# Patient Record
Sex: Male | Born: 1970 | Race: White | Hispanic: No | Marital: Married | Smoking: Former smoker
Health system: Southern US, Community
[De-identification: ages and names within clinical notes are randomized; demographics above are authoritative.]

## PROBLEM LIST (undated history)

## (undated) DIAGNOSIS — E785 Hyperlipidemia, unspecified: Secondary | ICD-10-CM

## (undated) DIAGNOSIS — Z8674 Personal history of sudden cardiac arrest: Secondary | ICD-10-CM

## (undated) DIAGNOSIS — I5020 Unspecified systolic (congestive) heart failure: Secondary | ICD-10-CM

## (undated) DIAGNOSIS — I2510 Atherosclerotic heart disease of native coronary artery without angina pectoris: Secondary | ICD-10-CM

## (undated) DIAGNOSIS — I255 Ischemic cardiomyopathy: Secondary | ICD-10-CM

## (undated) HISTORY — DX: Atherosclerotic heart disease of native coronary artery without angina pectoris: I25.10

## (undated) HISTORY — PX: CARDIAC CATHETERIZATION: SHX172

## (undated) HISTORY — DX: Unspecified systolic (congestive) heart failure: I50.20

## (undated) HISTORY — DX: Ischemic cardiomyopathy: I25.5

## (undated) HISTORY — DX: Personal history of sudden cardiac arrest: Z86.74

## (undated) HISTORY — DX: Hyperlipidemia, unspecified: E78.5

---

## 2010-12-01 DIAGNOSIS — I2510 Atherosclerotic heart disease of native coronary artery without angina pectoris: Secondary | ICD-10-CM

## 2010-12-01 HISTORY — DX: Atherosclerotic heart disease of native coronary artery without angina pectoris: I25.10

## 2011-07-28 HISTORY — PX: CORONARY ARTERY BYPASS GRAFT: SHX141

## 2011-08-08 HISTORY — PX: CARDIAC DEFIBRILLATOR PLACEMENT: SHX171

## 2014-07-26 ENCOUNTER — Encounter: Payer: Self-pay | Admitting: Internal Medicine

## 2014-07-26 ENCOUNTER — Ambulatory Visit (INDEPENDENT_AMBULATORY_CARE_PROVIDER_SITE_OTHER): Payer: BC Managed Care – HMO | Admitting: Internal Medicine

## 2014-07-26 VITALS — BP 98/72 | HR 64 | Ht 71.0 in | Wt 222.0 lb

## 2014-07-26 DIAGNOSIS — I209 Angina pectoris, unspecified: Secondary | ICD-10-CM

## 2014-07-26 DIAGNOSIS — I25810 Atherosclerosis of coronary artery bypass graft(s) without angina pectoris: Secondary | ICD-10-CM | POA: Insufficient documentation

## 2014-07-26 DIAGNOSIS — I25709 Atherosclerosis of coronary artery bypass graft(s), unspecified, with unspecified angina pectoris: Secondary | ICD-10-CM

## 2014-07-26 DIAGNOSIS — I4901 Ventricular fibrillation: Secondary | ICD-10-CM

## 2014-07-26 DIAGNOSIS — I469 Cardiac arrest, cause unspecified: Secondary | ICD-10-CM

## 2014-07-26 DIAGNOSIS — I2589 Other forms of chronic ischemic heart disease: Secondary | ICD-10-CM

## 2014-07-26 DIAGNOSIS — Z95810 Presence of automatic (implantable) cardiac defibrillator: Secondary | ICD-10-CM

## 2014-07-26 DIAGNOSIS — I255 Ischemic cardiomyopathy: Secondary | ICD-10-CM

## 2014-07-26 LAB — MDC_IDC_ENUM_SESS_TYPE_INCLINIC
Date Time Interrogation Session: 20150826145204
HIGH POWER IMPEDANCE MEASURED VALUE: 43.6103
Lead Channel Impedance Value: 412.5 Ohm
Lead Channel Pacing Threshold Amplitude: 1 V
Lead Channel Sensing Intrinsic Amplitude: 12 mV
Lead Channel Setting Pacing Pulse Width: 0.5 ms
MDC IDC MSMT BATTERY REMAINING LONGEVITY: 85.2 mo
MDC IDC MSMT LEADCHNL RV PACING THRESHOLD PULSEWIDTH: 0.5 ms
MDC IDC PG SERIAL: 1027081
MDC IDC SET LEADCHNL RV PACING AMPLITUDE: 2.5 V
MDC IDC SET LEADCHNL RV SENSING SENSITIVITY: 0.5 mV
MDC IDC STAT BRADY RV PERCENT PACED: 0 %
Zone Setting Detection Interval: 320 ms

## 2014-07-26 MED ORDER — NITROGLYCERIN 0.4 MG SL SUBL: 0.4000 mg | SUBLINGUAL_TABLET | SUBLINGUAL | Status: DC | PRN

## 2014-07-26 NOTE — Progress Notes (Signed)
Primary Cardiologist:  To establish with Dr Kirke Corin in [facility] Hobby is a 43 y.o. male with a h/o CAD s/p STEMI with cardiogenic shock 2012 who presents to establish care in the EP clinic.  He is s/p ICD implant at [facility] and now presents to establish in our clinic after recently moving to [facility] for his wife's job.  He reports being in good health until 07/08/2011 when he had acute STEMI.  He presented and underwent cath which showed 100% LAD occlusion, 100% RCA occlusion, and 80% LCx stenosis.  Intervention was performed but per report, this was aborted due to dissection.  He underwent CAG 07/28/11.  2 days later, he had a VF arrest.  Emergent cath revealed patent CABG grafts but distal CAD distal to graft touchdown.  He underwent implantation of a SJM Fortify ST VR ICD 08/08/11.  He has done reasonably well since that time.  He reports NYHA Class III CHF symptoms.  Medical therapy has been limited by hypotension and postural dizziness.  He has stable angina.  He reports SOB with moderate activity.  He has stable BLE edema.  He says that he had a myoview 4/15 which revealed EF 29%.    Today, he denies symptoms of palpitations, orthopnea, PND,  presyncope, syncope, or neurologic sequela. The patient is tolerating medications without difficulties and is otherwise without complaint today.   Past Medical History  Diagnosis Date  . CAD (coronary artery disease) 2012    STEMI 07/08/11  . Ischemic cardiomyopathy   . Chronic systolic dysfunction of left ventricle   . H/O cardiac arrest     VF arrest 2 days following CABG   Past Surgical History  Procedure Laterality Date  . Coronary artery bypass graft  07/28/11    LIMA to OM, SVG to LAD, SVGF to PDA  . Cardiac defibrillator placement  08/08/11    SJM Fortify ST VR implanted by Dr [facility]    Current Outpatient Prescriptions  Medication Sig Dispense Refill  . allopurinol (ZYLOPRIM) 100 MG tablet Take 100  mg by mouth daily.      Marland Kitchen aspirin 81 MG tablet Take 81 mg by mouth daily.      Marland Kitchen atorvastatin (LIPITOR) 40 MG tablet Take 40 mg by mouth daily.      Marland Kitchen eplerenone (INSPRA) 50 MG tablet Take 50 mg by mouth daily.      Marland Kitchen lisinopril (PRINIVIL,ZESTRIL) 5 MG tablet Take 5 mg by mouth daily.      . magnesium oxide (MAG-OX) 400 MG tablet Take 400 mg by mouth 2 (two) times daily.      . metoprolol succinate (TOPROL-XL) 25 MG 24 hr tablet Take 25 mg by mouth daily.      . Multiple Vitamin (MULTIVITAMIN) capsule Take 1 capsule by mouth daily.      Marland Kitchen omeprazole (PRILOSEC) 20 MG capsule Take 20 mg by mouth daily.      Marland Kitchen torsemide (DEMADEX) 10 MG tablet Take 10 mg by mouth every morning.      . nitroGLYCERIN (NITROSTAT) 0.4 MG SL tablet Place 1 tablet (0.4 mg total) under the tongue every 5 (five) minutes as needed for chest pain.  90 tablet  3   No current facility-administered medications for this visit.    Allergies  Allergen Reactions  . Penicillins Hives    As a child    History   Social History  . Marital Status: Married  Spouse Name: N/A    Number of Children: N/A  . Years of Education: N/A   Occupational History  . Not on file.   Social History Main Topics  . Smoking status: Former Smoker -- 1.00 packs/day for 15 years    Types: Cigarettes    Quit date: 07/02/2011  . Smokeless tobacco: Not on file  . Alcohol Use: No  . Drug Use: No  . Sexual Activity: Not on file   Other Topics Concern  . Not on file   Social History Narrative   Recently moved to [facility] for his wife's job.  He is currently unemployed.    Family History  Problem Relation Age of Onset  . Cancer Father     ROS- All systems are reviewed and negative except as per the HPI above  Physical Exam: Filed Vitals:   07/26/14 1355  BP: 98/72  Pulse: 64  Height:  (1.803 m)  Weight: 222 lb (100.699 kg)    GEN- The patient is well appearing, alert and oriented x 3 today.   Head- normocephalic,  atraumatic Eyes-  Sclera clear, conjunctiva pink Ears- hearing intact Oropharynx- clear Neck- supple, no JVP Lymph- no cervical lymphadenopathy Lungs- Clear to ausculation bilaterally, normal work of breathing Heart- Regular rate and rhythm, no murmurs, rubs or gallops, PMI not laterally displaced GI- soft, NT, ND, + BS Extremities- no clubbing, cyanosis, +1 edema MS- no significant deformity or atrophy Skin- no rash or lesion, ICD pocket is well healed Psych- euthymic mood, full affect Neuro- strength and sensation are intact  EKG today reveals sinus rhythm 64 bpm, PR 190, QRS 104, Qtc 420, anterior MI Over 50 pages of records from [facility] are reviewed ICD interrogation today is reviewed (see paceart)  Assessment and Plan:   1. Ischemic CM/ chronic systolic dysfunction/CAD The patient has stable CAD with chronic NYHA Class III CHF.  He appears to be on a good medical regimen Normal ICD function See Arita Miss Art report No changes today I will refer to Dr Kirke Corin for general cardiology management.   He is in the Analyze ST clinical trial .  We will follow his ICD twice per year according to Analyze ST protocol in our office.

## 2014-07-26 NOTE — Patient Instructions (Addendum)
Your physician wants you to follow-up in: 6 months with device clinic/research and 12 months with Dr Jacquiline Doe will receive a reminder letter in the mail two months in advance. If you don't receive a letter, please call our office to schedule the follow-up appointment.  Remote monitoring is used to monitor your Pacemaker or ICD from home. This monitoring reduces the number of office visits required to check your device to one time per year. It allows [place] to keep an eye on the functioning of your device to ensure it is working properly. You are scheduled for a device check from home on 10/30/14. You may send your transmission at any time that day. If you have a wireless device, the transmission will be sent automatically. After your physician reviews your transmission, you will receive a postcard with your next transmission date.   You have been referred to Dr. Kirke Corin in [place] office for general cardiology care

## 2014-07-28 ENCOUNTER — Encounter: Payer: Self-pay | Admitting: Internal Medicine

## 2014-08-16 ENCOUNTER — Encounter: Payer: Self-pay | Admitting: Cardiovascular Disease

## 2014-08-16 ENCOUNTER — Ambulatory Visit (INDEPENDENT_AMBULATORY_CARE_PROVIDER_SITE_OTHER): Payer: BC Managed Care – HMO | Admitting: Cardiovascular Disease

## 2014-08-16 VITALS — BP 119/79 | HR 69 | Ht 71.0 in | Wt 221.5 lb

## 2014-08-16 DIAGNOSIS — I209 Angina pectoris, unspecified: Secondary | ICD-10-CM

## 2014-08-16 DIAGNOSIS — I25709 Atherosclerosis of coronary artery bypass graft(s), unspecified, with unspecified angina pectoris: Secondary | ICD-10-CM

## 2014-08-16 DIAGNOSIS — I2589 Other forms of chronic ischemic heart disease: Secondary | ICD-10-CM

## 2014-08-16 DIAGNOSIS — E785 Hyperlipidemia, unspecified: Secondary | ICD-10-CM

## 2014-08-16 DIAGNOSIS — I5022 Chronic systolic (congestive) heart failure: Secondary | ICD-10-CM

## 2014-08-16 DIAGNOSIS — I255 Ischemic cardiomyopathy: Secondary | ICD-10-CM

## 2014-08-16 DIAGNOSIS — I25810 Atherosclerosis of coronary artery bypass graft(s) without angina pectoris: Secondary | ICD-10-CM

## 2014-08-16 MED ORDER — SACUBITRIL-VALSARTAN 24-26 MG PO TABS: 1.0000 | ORAL_TABLET | Freq: Two times a day (BID) | ORAL | Status: DC

## 2014-08-16 MED ORDER — CYCLOBENZAPRINE HCL 10 MG PO TABS: 10.0000 mg | ORAL_TABLET | Freq: Three times a day (TID) | ORAL | Status: DC | PRN

## 2014-08-16 NOTE — Progress Notes (Signed)
EP: Dr. Johney Frame  HPI  This is a 43 year old male who is here today to establish cardiovascular care. He has known h/o CAD s/p STEMI with cardiogenic shock in 2012 .  He is s/p ICD implant at [facility] in [place] . He recently moved to [facility] for his wife's job who currently works at [employer].  He reports being in good health until 07/08/2011 when he had acute STEMI.  He presented and underwent cath which showed 100% LAD occlusion, 100% RCA occlusion, and 80% LCx stenosis.  Intervention was performed but per report, this was aborted due to dissection.  He underwent CAG 07/28/11.  2 days later, he had a VF arrest.  Emergent cath revealed patent CABG grafts but distal CAD distal to graft touchdown.  He underwent implantation of a SJM Fortify ST VR ICD 08/08/11.  He has done reasonably well since that time.  He reports NYHA Class III CHF symptoms.  Medical therapy has been limited by hypotension and postural dizziness.  He has stable angina and reports substernal chest tightness with moderate activities. He does not use sublingual nitroglycerin. He reports SOB with moderate activity.  He has stable BLE edema.  He says that he had a myoview 4/15 which revealed EF 29%.   He is currently not working and looking for jobs that he might be able to do considering his physical limitations. He is thinking of applying for disability at some point if he is not able to find an appropriate job.   Allergies  Allergen Reactions  . Penicillins Hives    As a child     Current Outpatient Prescriptions on File Prior to Visit  Medication Sig Dispense Refill  . allopurinol (ZYLOPRIM) 100 MG tablet Take 100 mg by mouth daily.      Marland Kitchen aspirin 81 MG tablet Take 81 mg by mouth daily.      Marland Kitchen atorvastatin (LIPITOR) 40 MG tablet Take 40 mg by mouth daily.      Marland Kitchen eplerenone (INSPRA) 50 MG tablet Take 50 mg by mouth daily.      . magnesium oxide (MAG-OX) 400 MG tablet Take 400 mg by mouth 2 (two) times daily.      .  metoprolol succinate (TOPROL-XL) 25 MG 24 hr tablet Take 25 mg by mouth daily.      . Multiple Vitamin (MULTIVITAMIN) capsule Take 1 capsule by mouth daily.      . nitroGLYCERIN (NITROSTAT) 0.4 MG SL tablet Place 1 tablet (0.4 mg total) under the tongue every 5 (five) minutes as needed for chest pain.  90 tablet  3  . omeprazole (PRILOSEC) 20 MG capsule Take 20 mg by mouth daily.      Marland Kitchen torsemide (DEMADEX) 10 MG tablet Take 10 mg by mouth every morning.       No current facility-administered medications on file prior to visit.     Past Medical History  Diagnosis Date  . CAD (coronary artery disease) 2012    STEMI 07/08/11  . Ischemic cardiomyopathy   . Chronic systolic dysfunction of left ventricle   . H/O cardiac arrest     VF arrest 2 days following CABG     Past Surgical History  Procedure Laterality Date  . Coronary artery bypass graft  07/28/11    LIMA to OM, SVG to LAD, SVGF to PDA  . Cardiac defibrillator placement  08/08/11    SJM Fortify ST VR implanted by Dr Selena Batten at [facility]  .  Cardiac catheterization      [place]     Family History  Problem Relation Age of Onset  . Cancer Father      History   Social History  . Marital Status: Married    Spouse Name: N/A    Number of Children: N/A  . Years of Education: N/A   Occupational History  . Not on file.   Social History Main Topics  . Smoking status: Former Smoker -- 1.00 packs/day for 15 years    Types: Cigarettes    Quit date: 07/02/2011  . Smokeless tobacco: Not on file  . Alcohol Use: Yes     Comment: occasional  . Drug Use: No  . Sexual Activity: Not on file   Other Topics Concern  . Not on file   Social History Narrative   Recently moved to [facility] for his wife's job.  He is currently unemployed.     ROS A 10 point review of system was performed. It is negative other than that mentioned in the history of present illness.   PHYSICAL EXAM   BP 119/79  Pulse 69  Ht   (1.803 m)  Wt 221 lb 8 oz (100.472 kg)  BMI 30.91 kg/m2 Constitutional: He is oriented to person, place, and time. He appears well-developed and well-nourished. No distress.  HENT: No nasal discharge.  Head: Normocephalic and atraumatic.  Eyes: Pupils are equal and round.  No discharge. Neck: Normal range of motion. Neck supple. No JVD present. No thyromegaly present.  Cardiovascular: Normal rate, regular rhythm, normal heart sounds. Exam reveals no gallop and no friction rub. No murmur heard.  Pulmonary/Chest: Effort normal and breath sounds normal. No stridor. No respiratory distress. He has no wheezes. He has no rales. He exhibits no tenderness.  Abdominal: Soft. Bowel sounds are normal. He exhibits no distension. There is no tenderness. There is no rebound and no guarding.  Musculoskeletal: Normal range of motion. He exhibits mild edema and no tenderness.  Neurological: He is alert and oriented to person, place, and time. Coordination normal.  Skin: Skin is warm and dry. No rash noted. He is not diaphoretic. No erythema. No pallor.  Psychiatric: He has a normal mood and affect. His behavior is normal. Judgment and thought content normal.       EKG: Recent ECG showed normal sinus rhythm with incomplete right bundle branch block and anteroseptal infarct   ASSESSMENT AND PLAN

## 2014-08-16 NOTE — Assessment & Plan Note (Signed)
Continue treatment with atorvastatin. Check lipid and liver profile in 2 weeks.

## 2014-08-16 NOTE — Patient Instructions (Addendum)
Your physician has recommended you make the following change in your medication:  Stop Lisinopril  Start Entresto 24 mg/26 mg twice daily  Start Flexeril 10 mg three times daily as needed for pain   Your physician recommends that you return for lab work in:  2 weeks  Fasting lipid and liver  BMP   Your physician recommends that you schedule a follow-up appointment in:  1 month with Dr. Kirke Corin

## 2014-08-16 NOTE — Assessment & Plan Note (Signed)
Most recent ejection fraction was 28%. He is currently in [place] Heart Association class III and appears to be euvolemic. He is on optimal medical therapy but I do recommend switching lisinopril to [facility] which was done today. Check labs in 2 weeks. I asked him to monitor his blood pressure over the next month.

## 2014-08-16 NOTE — Assessment & Plan Note (Signed)
He has mild stable angina with no recent worsening. I recommend continuing medical therapy.

## 2014-08-18 ENCOUNTER — Institutional Professional Consult (permissible substitution): Payer: Self-pay | Admitting: Cardiovascular Disease

## 2014-08-23 ENCOUNTER — Telehealth: Payer: Self-pay | Admitting: *Deleted

## 2014-08-23 NOTE — Telephone Encounter (Signed)
Please see note below. 

## 2014-08-23 NOTE — Telephone Encounter (Signed)
Patient needs prior auth on Entresto . Fax to [phone]

## 2014-08-24 ENCOUNTER — Encounter: Payer: Self-pay | Admitting: Cardiovascular Disease

## 2014-08-24 NOTE — Telephone Encounter (Signed)
Informed patient that I left samples for him at the front desk while we are waiting on his paper work to process Patient verbalized understanding

## 2014-08-24 NOTE — Telephone Encounter (Signed)
Form in Dr. Sheilah Pigeon box for approval 9/24

## 2014-08-29 ENCOUNTER — Institutional Professional Consult (permissible substitution): Payer: Self-pay | Admitting: Cardiovascular Disease

## 2014-08-29 ENCOUNTER — Other Ambulatory Visit (INDEPENDENT_AMBULATORY_CARE_PROVIDER_SITE_OTHER): Payer: PRIVATE HEALTH INSURANCE

## 2014-08-29 DIAGNOSIS — I255 Ischemic cardiomyopathy: Secondary | ICD-10-CM

## 2014-08-29 DIAGNOSIS — I2589 Other forms of chronic ischemic heart disease: Secondary | ICD-10-CM

## 2014-08-29 NOTE — Telephone Encounter (Signed)
Notified patient Roy Turner is approved for 1 year and we will contact [facility] to let them know as well.

## 2014-08-29 NOTE — Telephone Encounter (Signed)
Prior authorization is approved for 1 year starting 08/29/2014 per Maralyn SagoSarah with [employer] company.

## 2014-08-30 LAB — HEPATIC FUNCTION PANEL
ALT: 29 IU/L (ref 0–44)
AST: 23 IU/L (ref 0–40)
Albumin: 4.9 g/dL (ref 3.5–5.5)
Alkaline Phosphatase: 92 IU/L (ref 39–117)
Bilirubin, Direct: 0.11 mg/dL (ref 0.00–0.40)
Total Bilirubin: 0.4 mg/dL (ref 0.0–1.2)
Total Protein: 6.7 g/dL (ref 6.0–8.5)

## 2014-08-30 LAB — BASIC METABOLIC PANEL
BUN/Creatinine Ratio: 15 (ref 9–20)
BUN: 17 mg/dL (ref 6–24)
CALCIUM: 9.5 mg/dL (ref 8.7–10.2)
CHLORIDE: 98 mmol/L (ref 97–108)
CO2: 23 mmol/L (ref 18–29)
Creatinine, Ser: 1.16 mg/dL (ref 0.76–1.27)
GFR calc Af Amer: 89 mL/min/{1.73_m2} (ref >59)
GFR, EST NON AFRICAN AMERICAN: 77 mL/min/{1.73_m2} (ref >59)
Glucose: 114 mg/dL — ABNORMAL HIGH (ref 65–99)
POTASSIUM: 4.3 mmol/L (ref 3.5–5.2)
SODIUM: 141 mmol/L (ref 134–144)

## 2014-08-30 LAB — LIPID PANEL
CHOLESTEROL TOTAL: 162 mg/dL (ref 100–199)
Chol/HDL Ratio: 5.1 ratio units — ABNORMAL HIGH (ref 0.0–5.0)
HDL: 32 mg/dL — ABNORMAL LOW (ref >39)
LDL Calculated: 75 mg/dL (ref 0–99)
TRIGLYCERIDES: 275 mg/dL — AB (ref 0–149)
VLDL CHOLESTEROL CAL: 55 mg/dL — AB (ref 5–40)

## 2014-09-04 ENCOUNTER — Telehealth: Payer: Self-pay | Admitting: *Deleted

## 2014-09-04 MED ORDER — CVS FISH OIL 1000 MG PO CAPS: 3000.0000 mg | ORAL_CAPSULE | Freq: Every day | ORAL | Status: DC

## 2014-09-04 NOTE — Telephone Encounter (Signed)
Patient verbalized understanding  

## 2014-09-04 NOTE — Telephone Encounter (Signed)
Message copied by Fransico SettersBAUCOM, Corgan Mormile E on Mon Sep 04, 2014 11:24 AM ------      Message from: Lorine BearsARIDA, MUHAMMAD A      Created: Sat Sep 02, 2014  2:05 PM       Inform patient that labs were normal. Cholesterol was good but TG were high. Recommend adding Fish oil 3 grams daily. ------

## 2014-09-18 ENCOUNTER — Encounter: Payer: Self-pay | Admitting: Cardiovascular Disease

## 2014-09-18 ENCOUNTER — Ambulatory Visit (INDEPENDENT_AMBULATORY_CARE_PROVIDER_SITE_OTHER): Payer: PRIVATE HEALTH INSURANCE | Admitting: Cardiovascular Disease

## 2014-09-18 VITALS — BP 104/78 | HR 70 | Ht 72.0 in | Wt 226.5 lb

## 2014-09-18 DIAGNOSIS — R079 Chest pain, unspecified: Secondary | ICD-10-CM

## 2014-09-18 DIAGNOSIS — I25708 Atherosclerosis of coronary artery bypass graft(s), unspecified, with other forms of angina pectoris: Secondary | ICD-10-CM

## 2014-09-18 DIAGNOSIS — E785 Hyperlipidemia, unspecified: Secondary | ICD-10-CM

## 2014-09-18 DIAGNOSIS — I5022 Chronic systolic (congestive) heart failure: Secondary | ICD-10-CM

## 2014-09-18 DIAGNOSIS — Z95810 Presence of automatic (implantable) cardiac defibrillator: Secondary | ICD-10-CM

## 2014-09-18 MED ORDER — TORSEMIDE 10 MG PO TABS: 10.0000 mg | ORAL_TABLET | ORAL | Status: DC

## 2014-09-18 NOTE — Assessment & Plan Note (Signed)
He has chronic class II angina with no change in symptoms. Continue medical therapy.

## 2014-09-18 NOTE — Patient Instructions (Signed)
Your physician recommends that you have labs today: BMP  Your Torsemide has been refilled   Your physician wants you to follow-up in: 6 months with Dr. Kirke CorinArida. You will receive a reminder letter in the mail two months in advance. If you don't receive a letter, please call our office to schedule the follow-up appointment.  Your physician recommends that you continue on your current medications as directed. Please refer to the Current Medication list given to you today.

## 2014-09-18 NOTE — Assessment & Plan Note (Signed)
Followed by Dr. Johney FrameAllred with no recent events.

## 2014-09-18 NOTE — Progress Notes (Signed)
EP: Dr. Johney FrameAllred  HPI  This is a 43 year old male who is here today for a followup visit regarding coronary artery disease and chronic systolic heart failure.  He has known h/o CAD s/p STEMI with cardiogenic shock in 2012 .  He is s/[facility] ICD implant [facility] [place] . [place] wife's job who currently works at [employer].  He reports being in good health until 07/08/2011 when he had acute STEMI.  He presented and underwent cath which showed 100% LAD occlusion, 100% RCA occlusion, and 80% LCx stenosis.  Intervention was performed but per report, this was aborted due to dissection.  He underwent CABG on 07/28/11.  2 days later, he had a VF arrest.  Emergent cath revealed patent CABG grafts but distal CAD distal to graft touchdown.  He underwent implantation of a SJM Fortify ST VR ICD 08/08/11.  He has done reasonably well since that time.  He reports NYHA Class III CHF symptoms.  Medical therapy has been limited by hypotension and postural dizziness.  He has stable angina and reports substernal chest tightness with moderate activities. He does not use sublingual nitroglycerin. He reports SOB with moderate activity.  He has stable BLE edema.  He says that he had a myoview 4/15 which revealed EF 29%.   He is currently not working and looking for jobs that he might be able to do considering his physical limitations. He is thinking of applying for disability at some point if he is not able to find an appropriate job. During her last visit, I switched him from lisinopril to [facility]. He reports no side effects except increased thirst. 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   Allergies  Allergen Reactions  . Penicillins Hives    As a child     Current Outpatient Prescriptions on File Prior to Visit  Medication Sig Dispense Refill  . allopurinol (ZYLOPRIM) 100 MG tablet Take 100 mg by mouth daily.      Marland Kitchen. aspirin 81 MG tablet Take 81 mg by mouth  daily.      Marland Kitchen. atorvastatin (LIPITOR) 40 MG tablet Take 40 mg by mouth daily.      . cyclobenzaprine (FLEXERIL) 10 MG tablet Take 1 tablet (10 mg total) by mouth 3 (three) times daily as needed for muscle spasms.  90 tablet  2  . eplerenone (INSPRA) 50 MG tablet Take 50 mg by mouth daily.      . magnesium oxide (MAG-OX) 400 MG tablet Take 400 mg by mouth 2 (two) times daily.      . metoprolol succinate (TOPROL-XL) 25 MG 24 hr tablet Take 25 mg by mouth daily.      . Multiple Vitamin (MULTIVITAMIN) capsule Take 1 capsule by mouth daily.      . nitroGLYCERIN (NITROSTAT) 0.4 MG SL tablet Place 1 tablet (0.4 mg total) under the tongue every 5 (five) minutes as needed for chest pain.  90 tablet  3  . Omega-3 Fatty Acids ([facility] FISH OIL) 1000 MG CAPS Take 3,000 mg by mouth daily.  90 capsule    . omeprazole (PRILOSEC) 20 MG capsule Take 20 mg by mouth daily.      . Sacubitril-Valsartan (ENTRESTO) 24-26 MG TABS Take 1 tablet by mouth 2 (two) times daily.  60 tablet  6   No current facility-administered medications on file prior to visit.     Past Medical History  Diagnosis Date  . CAD (coronary artery disease) 2012    STEMI 07/08/11  . Ischemic cardiomyopathy   . Chronic systolic dysfunction of left ventricle   . H/O cardiac arrest     VF arrest 2 days following CABG     Past Surgical History  Procedure Laterality Date  . Coronary artery bypass graft  07/28/11    LIMA to OM, SVG to LAD, SVGF to PDA  . Cardiac defibrillator placement  08/08/11    SJM Fortify ST VR implanted by Dr Selena [facility]attenKim at [facility]  . Cardiac catheterization      [place]     Family History  Problem Relation Age of Onset  . Cancer Father      History   Social History  . Marital Status: Married    Spouse Name: N/A    Number of Children: N/A  . Years of Education: N/A   Occupational History  . Not on file.   Social History Main Topics  . Smoking status: Former Smoker -- 1.00 packs/day for 15  years    Types: Cigarettes    Quit date: 07/02/2011  . Smokeless tobacco: Not on file  . Alcohol Use: Yes     Comment: occasional  . Drug Use: No  . Sexual Activity: Not on file   Other Topics Concern  . Not on file   Social History Narrative   [place].  He is currently unemployed.     ROS A 10 point review of system was performed. It is negative other than that mentioned in the history of present illness.   PHYSICAL EXAM   BP 104/78  Pulse 70  Ht 6' (1.829 m)  Wt 226 lb 8 oz (102.74 kg)  BMI 30.71 kg/m2 Constitutional: He is oriented to person, place, and time. He appears well-developed and well-nourished. No distress.  HENT: No nasal discharge.  Head: Normocephalic and atraumatic.  Eyes: Pupils are equal and round.  No discharge. Neck: Normal range of motion. Neck supple. No JVD present. No thyromegaly present.  Cardiovascular: Normal rate, regular rhythm, normal heart sounds. Exam reveals no gallop and no friction rub. No murmur heard.  Pulmonary/Chest: Effort normal and breath sounds normal. No stridor. No respiratory distress. He has no wheezes. He has no rales. He exhibits no tenderness.  Abdominal: Soft. Bowel sounds are normal. He exhibits no distension. There is no tenderness. There is no rebound and no guarding.  Musculoskeletal: Normal range of motion. He exhibits mild edema and no tenderness.  Neurological: He is alert and oriented to person, place, and time. Coordination normal.  Skin: Skin is warm and dry. No rash noted. He is not diaphoretic. No erythema. No pallor.  Psychiatric: He has a normal mood and affect. His behavior is normal. Judgment and thought content normal.       EKG: Sinus  Rhythm  -consider old inferior infarct  -Old anterior infarct.   -  Nonspecific T-abnormality.   ABNORMAL    ASSESSMENT AND PLAN

## 2014-09-18 NOTE — Assessment & Plan Note (Signed)
Lab Results  Component Value Date   HDL 32* 08/16/2014   LDLCALC 75 08/16/2014   TRIG 275* 08/16/2014   CHOLHDL 5.1* 08/16/2014   Continue atorvastatin. I added fish oil.

## 2014-09-18 NOTE — Assessment & Plan Note (Signed)
He appears to be euvolemic. He is on optimal medical therapy. Check basic metabolic profile today given the recent initiation of Entersto.

## 2014-09-19 LAB — BASIC METABOLIC PANEL
BUN/Creatinine Ratio: 16 (ref 9–20)
BUN: 17 mg/dL (ref 6–24)
CALCIUM: 9.6 mg/dL (ref 8.7–10.2)
CO2: 25 mmol/L (ref 18–29)
Chloride: 99 mmol/L (ref 97–108)
Creatinine, Ser: 1.07 mg/dL (ref 0.76–1.27)
GFR, EST AFRICAN AMERICAN: 98 mL/min/{1.73_m2} (ref >59)
GFR, EST NON AFRICAN AMERICAN: 85 mL/min/{1.73_m2} (ref >59)
Glucose: 108 mg/dL — ABNORMAL HIGH (ref 65–99)
Potassium: 5.1 mmol/L (ref 3.5–5.2)
Sodium: 140 mmol/L (ref 134–144)

## 2014-10-30 ENCOUNTER — Ambulatory Visit (INDEPENDENT_AMBULATORY_CARE_PROVIDER_SITE_OTHER): Payer: PRIVATE HEALTH INSURANCE | Admitting: *Deleted

## 2014-10-30 DIAGNOSIS — I255 Ischemic cardiomyopathy: Secondary | ICD-10-CM

## 2014-11-01 NOTE — Progress Notes (Signed)
Remote ICD transmission.   

## 2014-11-08 ENCOUNTER — Other Ambulatory Visit: Payer: Self-pay | Admitting: Cardiovascular Disease

## 2014-11-13 ENCOUNTER — Encounter: Payer: Self-pay | Admitting: Cardiovascular Disease

## 2014-11-14 NOTE — Telephone Encounter (Signed)
LVM for Entresto rep Sherryll Burger(Carrie Anne) to check status of patient case

## 2014-11-16 ENCOUNTER — Telehealth: Payer: Self-pay

## 2014-11-16 NOTE — Telephone Encounter (Signed)
CALLED REGARDING A FORM THAT WAS FAXED YESTERDAY REGARDING PT PRIOR AUTH FOR [employer] FOR [employer]

## 2014-11-17 NOTE — Telephone Encounter (Signed)
Patient to fax new insurance card so I can update Entresto central  Entresto central aware

## 2014-11-30 NOTE — Telephone Encounter (Signed)
Left message for pt to call back to follow up, as I do not see that we have received update to fax.

## 2014-12-04 NOTE — Telephone Encounter (Signed)
Left message for pt to call back, as I do not see an updated ins card since 9/15. Asked pt to call back if he needs [place] to do anything further with this.

## 2014-12-04 NOTE — Telephone Encounter (Signed)
Spoke w/ pt.  He reports that he sent his updated insurance card via email to [employer].  He states that he has not heard anything back, so he is assuming everything is taken care of.  Advised pt that I will ask Joni Reining to call him back if further information is needed.

## 2014-12-05 NOTE — Telephone Encounter (Signed)
LVM letting patient know insurance info has been sent to [employer]

## 2014-12-08 ENCOUNTER — Other Ambulatory Visit: Payer: Self-pay

## 2014-12-08 ENCOUNTER — Encounter: Payer: Self-pay | Admitting: Cardiovascular Disease

## 2014-12-08 MED ORDER — ATORVASTATIN CALCIUM 40 MG PO TABS: 40.0000 mg | ORAL_TABLET | Freq: Every day | ORAL | Status: DC

## 2014-12-08 NOTE — Telephone Encounter (Signed)
Refill sent for atorvastatin. 

## 2014-12-12 ENCOUNTER — Telehealth: Payer: Self-pay | Admitting: *Deleted

## 2014-12-12 ENCOUNTER — Encounter: Payer: Self-pay | Admitting: Internal Medicine

## 2014-12-12 ENCOUNTER — Encounter: Payer: Self-pay | Admitting: Cardiovascular Disease

## 2014-12-12 ENCOUNTER — Telehealth: Payer: Self-pay | Admitting: Cardiovascular Disease

## 2014-12-12 NOTE — Telephone Encounter (Signed)
Wynelle Bourgeoisick w/ Entresto # [phone] calling in regards to prior auth on Rx.

## 2014-12-12 NOTE — Telephone Encounter (Signed)
Please see note below. 

## 2014-12-12 NOTE — Telephone Encounter (Signed)
Raiford NobleRick from [employer] needs us to call [phone],for a  verbal for prior auth on Entresto for patient

## 2014-12-13 NOTE — Telephone Encounter (Signed)
Returned call to Sherryll Burgerntresto  They are faxing prior auth paperwork today

## 2014-12-25 ENCOUNTER — Encounter: Payer: Self-pay | Admitting: Cardiovascular Disease

## 2014-12-25 MED ORDER — METOPROLOL SUCCINATE ER 25 MG PO TB24: 25.0000 mg | ORAL_TABLET | Freq: Every day | ORAL | Status: DC

## 2014-12-25 MED ORDER — OMEPRAZOLE 20 MG PO CPDR: 20.0000 mg | DELAYED_RELEASE_CAPSULE | Freq: Every day | ORAL | Status: DC

## 2014-12-25 MED ORDER — MAGNESIUM OXIDE 400 MG PO TABS: 400.0000 mg | ORAL_TABLET | Freq: Two times a day (BID) | ORAL | Status: AC

## 2014-12-25 MED ORDER — ATORVASTATIN CALCIUM 40 MG PO TABS
40.0000 mg | ORAL_TABLET | Freq: Every day | ORAL | Status: DC
Start: 2014-12-25 — End: 2015-06-25

## 2014-12-25 MED ORDER — TORSEMIDE 10 MG PO TABS: 10.0000 mg | ORAL_TABLET | ORAL | Status: DC

## 2014-12-25 MED ORDER — SACUBITRIL-VALSARTAN 24-26 MG PO TABS: 1.0000 | ORAL_TABLET | Freq: Two times a day (BID) | ORAL | Status: DC

## 2014-12-25 MED ORDER — EPLERENONE 50 MG PO TABS: 50.0000 mg | ORAL_TABLET | Freq: Every day | ORAL | Status: DC

## 2014-12-25 NOTE — Telephone Encounter (Signed)
LVM informing patient that his rx meds have been refilled for 3 days at his requested pharmacy

## 2014-12-27 ENCOUNTER — Telehealth: Payer: Self-pay | Admitting: *Deleted

## 2014-12-27 NOTE — Telephone Encounter (Signed)
[facility]

## 2014-12-28 NOTE — Telephone Encounter (Signed)
Prior auth faxed

## 2014-12-31 ENCOUNTER — Encounter: Payer: Self-pay | Admitting: Cardiovascular Disease

## 2015-01-01 NOTE — Telephone Encounter (Signed)
[facility]

## 2015-01-05 ENCOUNTER — Encounter: Payer: Self-pay | Admitting: Cardiovascular Disease

## 2015-01-11 MED ORDER — EPLERENONE 50 MG PO TABS: 50.0000 mg | ORAL_TABLET | Freq: Every day | ORAL | Status: DC

## 2015-01-17 ENCOUNTER — Ambulatory Visit (INDEPENDENT_AMBULATORY_CARE_PROVIDER_SITE_OTHER): Payer: BLUE CROSS/BLUE SHIELD | Admitting: *Deleted

## 2015-01-17 DIAGNOSIS — I255 Ischemic cardiomyopathy: Secondary | ICD-10-CM | POA: Diagnosis not present

## 2015-01-17 LAB — MDC_IDC_ENUM_SESS_TYPE_INCLINIC
Battery Remaining Longevity: 80.4 mo
Brady Statistic RV Percent Paced: 0 %
HIGH POWER IMPEDANCE MEASURED VALUE: 43.0398
Implantable Pulse Generator Serial Number: 1027081
Lead Channel Pacing Threshold Amplitude: 1 V
Lead Channel Pacing Threshold Pulse Width: 0.5 ms
Lead Channel Setting Pacing Amplitude: 2.5 V
Lead Channel Setting Pacing Pulse Width: 0.5 ms
MDC IDC MSMT LEADCHNL RV IMPEDANCE VALUE: 412.5 Ohm
MDC IDC MSMT LEADCHNL RV SENSING INTR AMPL: 12 mV
MDC IDC SESS DTM: 20160217103049
MDC IDC SET LEADCHNL RV SENSING SENSITIVITY: 0.5 mV
MDC IDC SET ZONE DETECTION INTERVAL: 320 ms

## 2015-01-17 NOTE — Progress Notes (Signed)
ICD check in clinic. Normal device function. Thresholds and sensing consistent with previous device measurements. Impedance trends stable over time. No evidence of any ventricular arrhythmias. Histogram distribution appropriate for patient and level of activity. ST changes made per protocol. Device programmed at appropriate safety margins. Device programmed to optimize intrinsic conduction. Estimated longevity 6.7 years. Pt enrolled in remote follow-up.  Alert tones/vibration demonstrated for patient. Merlin 04-18-15 and ROV in August with JA.

## 2015-01-19 NOTE — Progress Notes (Addendum)
ANALYZE ST Informed Consent   Subject Name: [facility]  Subject met inclusion and exclusion criteria. The informed consent form, study requirements and expectations were reviewed with the subject and questions and concerns were addressed prior to the signing of the consent form. The subject verbalized understanding of the trail requirements. The subject agreed to participate in the ANALYZE ST trial and signed the informed consent. The informed consent was obtained prior to performance of any protocol-specific procedures for the subject. A copy of the signed informed consent was given to the subject and a copy was placed in the subject's medical record.  Pamala Duffel 07/26/2014 Site transfer

## 2015-01-25 ENCOUNTER — Encounter: Payer: Self-pay | Admitting: Internal Medicine

## 2015-02-21 ENCOUNTER — Other Ambulatory Visit: Payer: Self-pay | Admitting: Cardiovascular Disease

## 2015-02-22 ENCOUNTER — Other Ambulatory Visit: Payer: Self-pay | Admitting: *Deleted

## 2015-02-22 MED ORDER — METOPROLOL SUCCINATE ER 25 MG PO TB24: 25.0000 mg | ORAL_TABLET | Freq: Every day | ORAL | Status: DC

## 2015-03-03 ENCOUNTER — Other Ambulatory Visit: Payer: Self-pay | Admitting: Cardiovascular Disease

## 2015-03-04 ENCOUNTER — Other Ambulatory Visit: Payer: Self-pay | Admitting: Cardiovascular Disease

## 2015-03-20 ENCOUNTER — Ambulatory Visit (INDEPENDENT_AMBULATORY_CARE_PROVIDER_SITE_OTHER): Payer: BLUE CROSS/BLUE SHIELD | Admitting: Cardiovascular Disease

## 2015-03-20 ENCOUNTER — Encounter: Payer: Self-pay | Admitting: Cardiovascular Disease

## 2015-03-20 VITALS — BP 114/74 | HR 77 | Ht 72.0 in | Wt 243.8 lb

## 2015-03-20 DIAGNOSIS — I5022 Chronic systolic (congestive) heart failure: Secondary | ICD-10-CM | POA: Diagnosis not present

## 2015-03-20 DIAGNOSIS — I25708 Atherosclerosis of coronary artery bypass graft(s), unspecified, with other forms of angina pectoris: Secondary | ICD-10-CM

## 2015-03-20 DIAGNOSIS — E785 Hyperlipidemia, unspecified: Secondary | ICD-10-CM

## 2015-03-20 DIAGNOSIS — R079 Chest pain, unspecified: Secondary | ICD-10-CM | POA: Diagnosis not present

## 2015-03-20 NOTE — Assessment & Plan Note (Signed)
He is doing reasonably well with no change in anginal symptoms. ECG is unchanged. Continue medical therapy.

## 2015-03-20 NOTE — Assessment & Plan Note (Signed)
Lab Results  Component Value Date   CHOL 162 08/16/2014   HDL 32* 08/16/2014   LDLCALC 75 08/16/2014   TRIG 275* 08/16/2014   CHOLHDL 5.1* 08/16/2014   Continue treatment with atorvastatin. Fish oil was added. Repeat studies in September 2016.

## 2015-03-20 NOTE — Patient Instructions (Signed)
Continue same medications.   Your physician wants you to follow-up in: 6 months.  You will receive a reminder letter in the mail two months in advance. If you don't receive a letter, please call our office to schedule the follow-up appointment.  

## 2015-03-20 NOTE — Progress Notes (Signed)
EP: Dr. Johney FrameAllred  HPI  This is a 44 year old male who is here today for a followup visit regarding coronary artery disease and chronic systolic heart failure.  He has known h/o CAD s/p STEMI with cardiogenic shock in 2012 .  He is s/[facility] ICD implant [facility] in [place] . [place] wife's job who currently works at [employer].  He reports being in good health until 07/08/2011 when he had acute STEMI.  He presented and underwent cath which showed 100% LAD occlusion, 100% RCA occlusion, and 80% LCx stenosis.  Intervention was performed but per report, this was aborted due to dissection.  He underwent CABG on 07/28/11.  2 days later, he had a VF arrest.  Emergent cath revealed patent CABG grafts but distal CAD distal to graft touchdown.  He underwent implantation of a SJM Fortify ST VR ICD 08/08/11.  He has done reasonably well since that time.    Medical therapy has been limited by hypotension and postural dizziness.   He says that he had a myoview 4/15 which revealed EF 29%.   He is currently working in [employer] from home. He had chest pain about 3 weeks ago that responded to nitroglycerin. It was associated with heartburn. He hasn't had recurrent episodes since then.   Allergies  Allergen Reactions  . Penicillins Hives    As a child     Current Outpatient Prescriptions on File Prior to Visit  Medication Sig Dispense Refill  . allopurinol (ZYLOPRIM) 100 MG tablet Take 100 mg by mouth daily.    Marland Kitchen. aspirin 81 MG tablet Take 81 mg by mouth daily.    Marland Kitchen. atorvastatin (LIPITOR) 40 MG tablet Take 1 tablet (40 mg total) by mouth daily. 3 tablet 0  . cyclobenzaprine (FLEXERIL) 10 MG tablet Take 1 tablet (10 mg total) by mouth 3 (three) times daily as needed for muscle spasms. 90 tablet 2  . eplerenone (INSPRA) 50 MG tablet TAKE 1 TABLET (50 MG TOTAL) BY MOUTH DAILY. 30 tablet 3  . magnesium oxide (MAG-OX) 400 MG tablet Take 1 tablet (400 mg total) by mouth 2 (two) times daily. 6  tablet 0  . metoprolol succinate (TOPROL-XL) 25 MG 24 hr tablet Take 1 tablet (25 mg total) by mouth daily. 30 tablet 3  . Multiple Vitamin (MULTIVITAMIN) capsule Take 1 capsule by mouth daily.    . nitroGLYCERIN (NITROSTAT) 0.4 MG SL tablet Place 1 tablet (0.4 mg total) under the tongue every 5 (five) minutes as needed for chest pain. 90 tablet 3  . Omega-3 Fatty Acids ([employer] FISH OIL) 1000 MG CAPS Take 3,000 mg by mouth daily. 90 capsule   . omeprazole (PRILOSEC) 20 MG capsule TAKE ONE CAPSULE BY MOUTH EVERY DAY 30 capsule 3  . sacubitril-valsartan (ENTRESTO) 24-26 MG Take 1 tablet by mouth 2 (two) times daily. 6 tablet 0  . torsemide (DEMADEX) 10 MG tablet Take 1 tablet (10 mg total) by mouth every morning. 3 tablet 0   No current facility-administered medications on file prior to visit.     Past Medical History  Diagnosis Date  . CAD (coronary artery disease) 2012    STEMI 07/08/11  . Ischemic cardiomyopathy   . Chronic systolic dysfunction of left ventricle   . H/O cardiac arrest     VF arrest 2 days following CABG     Past Surgical History  Procedure Laterality Date  . Coronary artery bypass graft  07/28/11  LIMA to OM, SVG to LAD, SVGF to PDA  . Cardiac defibrillator placement  08/08/11    SJM Fortify ST VR implanted by Dr Selena Batten at [facility]  . Cardiac catheterization      [place]     Family History  Problem Relation Age of Onset  . Cancer Father      History   Social History  . Marital Status: Married    Spouse Name: N/A  . Number of Children: N/A  . Years of Education: N/A   Occupational History  . Not on file.   Social History Main Topics  . Smoking status: Former Smoker -- 1.00 packs/day for 15 years    Types: Cigarettes    Quit date: 07/02/2011  . Smokeless tobacco: Not on file  . Alcohol Use: Yes     Comment: occasional  . Drug Use: No  . Sexual Activity: Not on file   Other Topics Concern  . Not on file   Social History  Narrative   Recently moved to [place] for his wife's job.  He is currently unemployed.     ROS A 10 point review of system was performed. It is negative other than that mentioned in the history of present illness.   PHYSICAL EXAM   BP 114/74 mmHg  Pulse 77  Ht 6' (1.829 m)  Wt 243 lb 12 oz (110.564 kg)  BMI 33.05 kg/m2 Constitutional: He is oriented to person, place, and time. He appears well-developed and well-nourished. No distress.  HENT: No nasal discharge.  Head: Normocephalic and atraumatic.  Eyes: Pupils are equal and round.  No discharge. Neck: Normal range of motion. Neck supple. No JVD present. No thyromegaly present.  Cardiovascular: Normal rate, regular rhythm, normal heart sounds. Exam reveals no gallop and no friction rub. No murmur heard.  Pulmonary/Chest: Effort normal and breath sounds normal. No stridor. No respiratory distress. He has no wheezes. He has no rales. He exhibits no tenderness.  Abdominal: Soft. Bowel sounds are normal. He exhibits no distension. There is no tenderness. There is no rebound and no guarding.  Musculoskeletal: Normal range of motion. He exhibits mild edema and no tenderness.  Neurological: He is alert and oriented to person, place, and time. Coordination normal.  Skin: Skin is warm and dry. No rash noted. He is not diaphoretic. No erythema. No pallor.  Psychiatric: He has a normal mood and affect. His behavior is normal. Judgment and thought content normal.       EKG: Sinus  Rhythm  -consider old inferior infarct  -Old anterior infarct.   -  Nonspecific T-abnormality.   ABNORMAL     ASSESSMENT AND PLAN

## 2015-03-20 NOTE — Assessment & Plan Note (Signed)
He is currently on optimal medical therapy. He appears to be euvolemic. New York Heart Association class improved to 2.

## 2015-03-28 ENCOUNTER — Telehealth: Payer: Self-pay

## 2015-03-28 ENCOUNTER — Other Ambulatory Visit: Payer: Self-pay

## 2015-03-28 NOTE — Telephone Encounter (Signed)
Left message for pt that we received notification from his pharmacy that he has been on omeprazole for more than 90 says.  Per Dr. Kirke CorinArida, he should d/c this if he has not having any symptoms.  Asked pt to call back if he feels that he should continue omeprazole or if he has any questions.

## 2015-03-31 ENCOUNTER — Other Ambulatory Visit: Payer: Self-pay | Admitting: Cardiovascular Disease

## 2015-04-16 ENCOUNTER — Other Ambulatory Visit: Payer: Self-pay | Admitting: Cardiovascular Disease

## 2015-04-16 ENCOUNTER — Encounter: Payer: Self-pay | Admitting: Cardiovascular Disease

## 2015-04-16 NOTE — Telephone Encounter (Signed)
S/w pt regarding Entresto prescription. Explained that we are working to get issue resolved and can provide him with samples until prescription is ready. Pt. Stated he would call tomorrow morning and pick up samples if needed.

## 2015-04-16 NOTE — Telephone Encounter (Signed)
Called [facility], indicated prescription ready for pick up with no prior authorization needed. Left message for patient.

## 2015-04-18 ENCOUNTER — Ambulatory Visit (INDEPENDENT_AMBULATORY_CARE_PROVIDER_SITE_OTHER): Payer: BLUE CROSS/BLUE SHIELD | Admitting: *Deleted

## 2015-04-18 ENCOUNTER — Telehealth: Payer: Self-pay | Admitting: Cardiology

## 2015-04-18 DIAGNOSIS — I255 Ischemic cardiomyopathy: Secondary | ICD-10-CM

## 2015-04-18 NOTE — Progress Notes (Signed)
Remote ICD transmission.   

## 2015-04-18 NOTE — Telephone Encounter (Signed)
Spoke with pt and reminded pt of remote transmission that is due today. Pt verbalized understanding.   

## 2015-05-04 LAB — CUP PACEART REMOTE DEVICE CHECK
Battery Remaining Percentage: 70 %
Brady Statistic RV Percent Paced: 1 % — CL
Date Time Interrogation Session: 20160603184319
HighPow Impedance: 46 Ohm
Lead Channel Impedance Value: 440 Ohm
Lead Channel Setting Pacing Pulse Width: 0.5 ms
MDC IDC MSMT LEADCHNL RV SENSING INTR AMPL: 12 mV
MDC IDC SET LEADCHNL RV PACING AMPLITUDE: 2.5 V
MDC IDC SET LEADCHNL RV SENSING SENSITIVITY: 0.5 mV
Pulse Gen Serial Number: 1027081
Zone Setting Detection Interval: 320 ms

## 2015-05-06 ENCOUNTER — Encounter: Payer: Self-pay | Admitting: Cardiovascular Disease

## 2015-05-07 ENCOUNTER — Other Ambulatory Visit: Payer: Self-pay

## 2015-05-07 DIAGNOSIS — E785 Hyperlipidemia, unspecified: Secondary | ICD-10-CM

## 2015-05-08 ENCOUNTER — Telehealth: Payer: Self-pay

## 2015-05-08 ENCOUNTER — Encounter: Payer: Self-pay | Admitting: Cardiovascular Disease

## 2015-05-08 ENCOUNTER — Other Ambulatory Visit: Payer: Self-pay

## 2015-05-08 NOTE — Telephone Encounter (Signed)
Spoke with Mr. Roy Turner regarding his insurance information which needs to be updated in our system since he no longer has [employer] as his insurance carrier. The patient will email his new Rx card to me and then we can start the prior authorization for the [facility].

## 2015-05-08 NOTE — Telephone Encounter (Signed)
Hello Dr Kirke CorinArida,         My company has changed our insurance. I would like to submit my new information to your office so that you may pursue approval for my Entresto medication. I have learned this one they typically require pre authorization and I would like to ask your office to obtain that as soon as possible. Please let me know where to email the copy of the new card information. Thank you, Therese Sarahom Kolasa

## 2015-05-09 ENCOUNTER — Encounter: Payer: Self-pay | Admitting: Cardiology

## 2015-05-15 ENCOUNTER — Other Ambulatory Visit: Payer: Self-pay

## 2015-05-15 ENCOUNTER — Encounter: Payer: Self-pay | Admitting: Internal Medicine

## 2015-05-25 NOTE — Telephone Encounter (Signed)
LMOM to have patient call the office regarding a $10.00 copay card that is good once a month through a year. Also left a message that the prior authorization has been faxed to the insurance company and just waiting for a reply.

## 2015-05-29 NOTE — Telephone Encounter (Signed)
LMOM the prior authorization has been approved from 05/29/2015 to 05/28/2016 for Entresto.

## 2015-06-06 ENCOUNTER — Telehealth: Payer: Self-pay

## 2015-06-06 NOTE — Telephone Encounter (Signed)
S/w patient who indicates he received two letters last week from [employer] regarding Entresto coverage. States first letter denied coverage; second letter approved Entresto. Pt also states he spoke with his insurance company Friday, July 1 and was told it had been approved for 12 months.  Pt thanked me for the call and states he will call if he needs anything else.

## 2015-06-11 ENCOUNTER — Other Ambulatory Visit: Payer: Self-pay

## 2015-06-11 MED ORDER — METOPROLOL SUCCINATE ER 25 MG PO TB24: 25.0000 mg | ORAL_TABLET | Freq: Every day | ORAL | Status: DC

## 2015-06-11 NOTE — Telephone Encounter (Signed)
Refill sent for metoprolol succ 

## 2015-06-13 ENCOUNTER — Other Ambulatory Visit: Payer: Self-pay

## 2015-06-13 MED ORDER — EPLERENONE 50 MG PO TABS: ORAL_TABLET | ORAL | Status: DC

## 2015-06-13 NOTE — Telephone Encounter (Signed)
Refill sent for inspra.

## 2015-06-25 ENCOUNTER — Other Ambulatory Visit: Payer: Self-pay

## 2015-06-25 MED ORDER — ATORVASTATIN CALCIUM 40 MG PO TABS: 40.0000 mg | ORAL_TABLET | Freq: Every day | ORAL | Status: DC

## 2015-06-25 NOTE — Telephone Encounter (Signed)
Refill sent for atorvastatin. 

## 2015-07-09 ENCOUNTER — Other Ambulatory Visit: Payer: Self-pay | Admitting: *Deleted

## 2015-07-09 MED ORDER — EPLERENONE 50 MG PO TABS: ORAL_TABLET | ORAL | Status: DC

## 2015-07-23 ENCOUNTER — Other Ambulatory Visit: Payer: Self-pay | Admitting: *Deleted

## 2015-07-23 MED ORDER — OMEPRAZOLE 20 MG PO CPDR: 20.0000 mg | DELAYED_RELEASE_CAPSULE | Freq: Every day | ORAL | Status: DC

## 2015-07-23 MED ORDER — TORSEMIDE 10 MG PO TABS: 10.0000 mg | ORAL_TABLET | ORAL | Status: DC

## 2015-07-23 MED ORDER — ATORVASTATIN CALCIUM 40 MG PO TABS: 40.0000 mg | ORAL_TABLET | Freq: Every day | ORAL | Status: DC

## 2015-08-07 ENCOUNTER — Other Ambulatory Visit: Payer: Self-pay | Admitting: *Deleted

## 2015-08-07 MED ORDER — SACUBITRIL-VALSARTAN 24-26 MG PO TABS: 1.0000 | ORAL_TABLET | Freq: Two times a day (BID) | ORAL | Status: DC

## 2015-08-08 ENCOUNTER — Encounter: Payer: Self-pay | Admitting: Internal Medicine

## 2015-08-08 ENCOUNTER — Ambulatory Visit (INDEPENDENT_AMBULATORY_CARE_PROVIDER_SITE_OTHER): Payer: Managed Care, Other (non HMO) | Admitting: Internal Medicine

## 2015-08-08 ENCOUNTER — Encounter: Payer: Self-pay | Admitting: *Deleted

## 2015-08-08 VITALS — BP 116/62 | HR 83 | Ht 71.0 in | Wt 246.8 lb

## 2015-08-08 DIAGNOSIS — I255 Ischemic cardiomyopathy: Secondary | ICD-10-CM | POA: Diagnosis not present

## 2015-08-08 DIAGNOSIS — Z006 Encounter for examination for normal comparison and control in clinical research program: Secondary | ICD-10-CM

## 2015-08-08 DIAGNOSIS — I5022 Chronic systolic (congestive) heart failure: Secondary | ICD-10-CM

## 2015-08-08 DIAGNOSIS — I469 Cardiac arrest, cause unspecified: Secondary | ICD-10-CM

## 2015-08-08 DIAGNOSIS — Z95810 Presence of automatic (implantable) cardiac defibrillator: Secondary | ICD-10-CM

## 2015-08-08 NOTE — Patient Instructions (Addendum)
Medication Instructions:  Your physician recommends that you continue on your current medications as directed. Please refer to the Current Medication list given to you today.   Labwork: None ordered  Testing/Procedures: None ordered  Follow-Up: Your physician wants you to follow-up in: 12 months with Francis Dowse, EP PA You will receive a reminder letter in the mail two months in advance. If you don't receive a letter, please call our office to schedule the follow-up appointment.  Remote monitoring is used to monitor your Pacemaker  from home. This monitoring reduces the number of office visits required to check your device to one time per year. It allows [place] to keep an eye on the functioning of your device to ensure it is working properly. You are scheduled for a device check from home on 11/07/15. You may send your transmission at any time that day. If you have a wireless device, the transmission will be sent automatically. After your physician reviews your transmission, you will receive a postcard with your next transmission date.    Any Other Special Instructions Will Be Listed Below (If Applicable).

## 2015-08-08 NOTE — Progress Notes (Signed)
Electrophysiology Office Note   Date:  08/08/2015   ID:  Roy Turner, DOB 05/01/1971, MRN [id number]  PCP:  No PCP Per Patient  Cardiologist:  Dr. Kirke Corin Primary Electrophysiologist: Dr. Johney Frame  Chief Complaint  Patient presents with  . ICM     History of Present Illness: Roy Turner is a 44 y.o. male who presents today for electrophysiology follow up and device check.  He is feeling very well, states he has had a great year so far.  He follows closely withhis primary cardiologist.  He has not had CP, no SOB or physical limitations, no dizziness, no syncope, no shocks.    The patient is tolerating medications without difficulties and is otherwise without complaint today.    Past Medical History  Diagnosis Date  . CAD (coronary artery disease) 2012    STEMI 07/08/11  . Ischemic cardiomyopathy     St. Jude ICD  . Chronic systolic dysfunction of left ventricle   . H/O cardiac arrest     VF arrest 2 days following CABG   Past Surgical History  Procedure Laterality Date  . Coronary artery bypass graft  07/28/11    LIMA to OM, SVG to LAD, SVGF to PDA  . Cardiac defibrillator placement  08/08/11    SJM Fortify ST VR implanted by Dr Selena Batten at [facility]  . Cardiac catheterization      [facility]     Current Outpatient Prescriptions  Medication Sig Dispense Refill  . allopurinol (ZYLOPRIM) 100 MG tablet Take 100 mg by mouth daily.    Marland Kitchen aspirin 81 MG tablet Take 81 mg by mouth daily.    Marland Kitchen atorvastatin (LIPITOR) 40 MG tablet Take 1 tablet (40 mg total) by mouth daily. 30 tablet 3  . cyclobenzaprine (FLEXERIL) 10 MG tablet Take 1 tablet (10 mg total) by mouth 3 (three) times daily as needed for muscle spasms. 90 tablet 2  . eplerenone (INSPRA) 50 MG tablet TAKE 1 TABLET (50 MG TOTAL) BY MOUTH DAILY. 30 tablet 3  . magnesium oxide (MAG-OX) 400 MG tablet Take 1 tablet (400 mg total) by mouth 2 (two) times daily. 6 tablet 0  . metoprolol succinate (TOPROL-XL) 25 MG 24 hr  tablet Take 1 tablet (25 mg total) by mouth daily. 30 tablet 3  . Multiple Vitamin (MULTIVITAMIN) capsule Take 1 capsule by mouth daily.    . nitroGLYCERIN (NITROSTAT) 0.4 MG SL tablet Place 1 tablet (0.4 mg total) under the tongue every 5 (five) minutes as needed for chest pain. 90 tablet 3  . Omega-3 Fatty Acids (CVS FISH OIL) 1000 MG CAPS Take 3,000 mg by mouth daily. 90 capsule   . sacubitril-valsartan (ENTRESTO) 24-26 MG Take 1 tablet by mouth 2 (two) times daily. 60 tablet 3  . torsemide (DEMADEX) 10 MG tablet Take 1 tablet (10 mg total) by mouth every morning. 3 tablet 3   No current facility-administered medications for this visit.    Allergies:   Penicillins   Social History:  The patient  reports that he quit smoking about 4 years ago. His smoking use included Cigarettes. He has a 15 pack-year smoking history. He does not have any smokeless tobacco history on file. He reports that he drinks alcohol. He reports that he does not use illicit drugs.   Family History:  The patient's family history includes Cancer in his father.    ROS:  Please see the history of present illness.   All other systems are reviewed and negative.  PHYSICAL EXAM: VS:  BP 116/62 mmHg  Pulse 83  Ht 5\' 11"  (1.803 m)  Wt 111.948 kg (246 lb 12.8 oz)  BMI 34.44 kg/m2 , BMI Body mass index is 34.44 kg/(m^2). GEN: Well nourished, overwieght, in no acute distress HEENT: normal Neck: no JVD, carotid bruits, or masses Cardiac: RRR; no murmurs, rubs, or gallops, no edema  Respiratory:  clear to auscultation bilaterally, normal work of breathing GI: soft, nontender, nondistended, + BS MS: no deformity or atrophy Skin: warm and dry  Neuro:  Strength and sensation are intact Psych: euthymic mood, full affect  EKG:   The ekg ordered today shows SR, incomplete RBBB, anteroseptal infarct pattern   Recent Labs: 08/16/2014: ALT 29 09/18/2014: BUN 17; Creatinine, Ser 1.07; Potassium 5.1; Sodium 140    Lipid  Panel     Component Value Date/Time   CHOL 162 08/16/2014 1031   TRIG 275* 08/16/2014 1031   HDL 32* 08/16/2014 1031   CHOLHDL 5.1* 08/16/2014 1031   LDLCALC 75 08/16/2014 1031     Wt Readings from Last 3 Encounters:  08/08/15 111.948 kg (246 lb 12.8 oz)  03/20/15 110.564 kg (243 lb 12 oz)  09/18/14 102.74 kg (226 lb 8 oz)      Other studies Reviewed:  ASSESSMENT AND PLAN:  1.  CAD, history of STEMI/CABG/ICD in 2012 in [place], follows with Dr Kirke Corin      ICD interrogation today without VT, see interrogation report      The pt was in the Analyze ST study.  ST alerts are turned off as study is now complete.   Follow-up:  1 year  For EP visit, continue remote device monitoring and see his PMD and Dr. Kirke Corin as directed   Current medicines are reviewed at length with the patient today.   The patient does not have concerns regarding his medicines.     SignedHillis Range, MD  08/08/2015 10:40 PM     [facility] [address] [place] [address] [phone] (office) [phone] (fax)

## 2015-08-08 NOTE — Progress Notes (Signed)
Analyze ST Final Research Visit- Pt seen in clinic. Device checked by industry and research. No ST alerts. Research ST thresholds turned off per protocol. Pt released from research study Merlin and transferred to [facility] for ongoing remote monitoring. EKG WNL reviewed by Dr. Johney Frame. BP 116/62 HR 83.

## 2015-08-22 LAB — CUP PACEART INCLINIC DEVICE CHECK
HighPow Impedance: 44.7912
Lead Channel Impedance Value: 425 Ohm
Lead Channel Pacing Threshold Pulse Width: 0.5 ms
Lead Channel Setting Sensing Sensitivity: 0.5 mV
MDC IDC MSMT BATTERY REMAINING LONGEVITY: 75.6 mo
MDC IDC MSMT LEADCHNL RV PACING THRESHOLD AMPLITUDE: 1 V
MDC IDC MSMT LEADCHNL RV PACING THRESHOLD AMPLITUDE: 1 V
MDC IDC MSMT LEADCHNL RV PACING THRESHOLD PULSEWIDTH: 0.5 ms
MDC IDC MSMT LEADCHNL RV SENSING INTR AMPL: 12 mV
MDC IDC SESS DTM: 20160907200455
MDC IDC SET LEADCHNL RV PACING AMPLITUDE: 2.5 V
MDC IDC SET LEADCHNL RV PACING PULSEWIDTH: 0.5 ms
MDC IDC SET ZONE DETECTION INTERVAL: 320 ms
MDC IDC STAT BRADY RV PERCENT PACED: 0 %
Pulse Gen Serial Number: 1027081

## 2015-09-17 ENCOUNTER — Other Ambulatory Visit: Payer: Self-pay | Admitting: Internal Medicine

## 2015-09-17 MED ORDER — METOPROLOL SUCCINATE ER 25 MG PO TB24: 25.0000 mg | ORAL_TABLET | Freq: Every day | ORAL | Status: DC

## 2015-09-20 ENCOUNTER — Ambulatory Visit (INDEPENDENT_AMBULATORY_CARE_PROVIDER_SITE_OTHER): Payer: Managed Care, Other (non HMO) | Admitting: Cardiovascular Disease

## 2015-09-20 ENCOUNTER — Encounter: Payer: Self-pay | Admitting: Cardiovascular Disease

## 2015-09-20 VITALS — BP 110/80 | HR 97 | Ht 71.0 in | Wt 242.2 lb

## 2015-09-20 DIAGNOSIS — E785 Hyperlipidemia, unspecified: Secondary | ICD-10-CM

## 2015-09-20 DIAGNOSIS — I5022 Chronic systolic (congestive) heart failure: Secondary | ICD-10-CM

## 2015-09-20 DIAGNOSIS — I25810 Atherosclerosis of coronary artery bypass graft(s) without angina pectoris: Secondary | ICD-10-CM

## 2015-09-20 NOTE — Progress Notes (Signed)
EP: Dr. Johney FrameAllred  HPI  This is a [age] year old male who is here today for a followup visit regarding coronary artery disease and chronic systolic heart failure.  He has known h/o CAD s/p STEMI with cardiogenic shock in 2012 .  He is s/p ICD implant at [facility] [place] . [place] wife's job who currently works at [employer].  He reports being in good health until 07/08/2011 when he had acute STEMI.  He presented and underwent cath which showed 100% LAD occlusion, 100% RCA occlusion, and 80% LCx stenosis.  Intervention was performed but per report, this was aborted due to dissection.  He underwent CABG on 07/28/11.  2 days later, he had a VF arrest.  Emergent cath revealed patent CABG grafts but distal CAD distal to graft touchdown.  He underwent implantation of a SJM Fortify ST VR ICD 08/08/11.  He has done reasonably well since that time.    Medical therapy has been limited by hypotension and postural dizziness.   He says that he had a myoview 4/15 which revealed EF 29%.   He is currently working in [employer] from home.   He has been doing very well and denies any chest pain or significant dyspnea.  Allergies  Allergen Reactions  . Penicillins Hives    As a child     Current Outpatient Prescriptions on File Prior to Visit  Medication Sig Dispense Refill  . allopurinol (ZYLOPRIM) 100 MG tablet Take 100 mg by mouth daily.    Marland Kitchen. aspirin 81 MG tablet Take 81 mg by mouth daily.    Marland Kitchen. atorvastatin (LIPITOR) 40 MG tablet Take 1 tablet (40 mg total) by mouth daily. 30 tablet 3  . cyclobenzaprine (FLEXERIL) 10 MG tablet Take 1 tablet (10 mg total) by mouth 3 (three) times daily as needed for muscle spasms. 90 tablet 2  . eplerenone (INSPRA) 50 MG tablet TAKE 1 TABLET (50 MG TOTAL) BY MOUTH DAILY. 30 tablet 3  . magnesium oxide (MAG-OX) 400 MG tablet Take 1 tablet (400 mg total) by mouth 2 (two) times daily. 6 tablet 0  . metoprolol succinate (TOPROL-XL) 25 MG 24 hr tablet  Take 1 tablet (25 mg total) by mouth daily. 30 tablet 11  . Multiple Vitamin (MULTIVITAMIN) capsule Take 1 capsule by mouth daily.    . nitroGLYCERIN (NITROSTAT) 0.4 MG SL tablet Place 1 tablet (0.4 mg total) under the tongue every 5 (five) minutes as needed for chest pain. 90 tablet 3  . Omega-3 Fatty Acids ([facility] FISH OIL) 1000 MG CAPS Take 3,000 mg by mouth daily. 90 capsule   . sacubitril-valsartan (ENTRESTO) 24-26 MG Take 1 tablet by mouth 2 (two) times daily. 60 tablet 3  . torsemide (DEMADEX) 10 MG tablet Take 1 tablet (10 mg total) by mouth every morning. 3 tablet 3   No current facility-administered medications on file prior to visit.     Past Medical History  Diagnosis Date  . CAD (coronary artery disease) 2012    STEMI 07/08/11  . Ischemic cardiomyopathy     St. Jude ICD  . Chronic systolic dysfunction of left ventricle   . H/O cardiac arrest     VF arrest 2 days following CABG     Past Surgical History  Procedure Laterality Date  . Coronary artery bypass graft  07/28/11    LIMA to OM, SVG to LAD, SVGF to PDA  . Cardiac defibrillator placement  08/08/11  SJM Fortify ST VR implanted by Dr Selena Batten at [facility]  . Cardiac catheterization      [place]     Family History  Problem Relation Age of Onset  . Cancer Father      Social History   Social History  . Marital Status: Married    Spouse Name: N/A  . Number of Children: N/A  . Years of Education: N/A   Occupational History  . Not on file.   Social History Main Topics  . Smoking status: Former Smoker -- 1.00 packs/day for 15 years    Types: Cigarettes    Quit date: 07/02/2011  . Smokeless tobacco: Not on file  . Alcohol Use: Yes     Comment: occasional  . Drug Use: No  . Sexual Activity: Not on file   Other Topics Concern  . Not on file   Social History Narrative   Recently moved to [facility] for his wife's job.  He is currently unemployed.     ROS A 10 point review of system  was performed. It is negative other than that mentioned in the history of present illness.   PHYSICAL EXAM   BP 110/80 mmHg  Pulse 97  Ht  (1.803 m)  Wt 242 lb 4 oz (109.884 kg)  BMI 33.80 kg/m2 Constitutional: He is oriented to person, place, and time. He appears well-developed and well-nourished. No distress.  HENT: No nasal discharge.  Head: Normocephalic and atraumatic.  Eyes: Pupils are equal and round.  No discharge. Neck: Normal range of motion. Neck supple. No JVD present. No thyromegaly present.  Cardiovascular: Normal rate, regular rhythm, normal heart sounds. Exam reveals no gallop and no friction rub. No murmur heard.  Pulmonary/Chest: Effort normal and breath sounds normal. No stridor. No respiratory distress. He has no wheezes. He has no rales. He exhibits no tenderness.  Abdominal: Soft. Bowel sounds are normal. He exhibits no distension. There is no tenderness. There is no rebound and no guarding.  Musculoskeletal: Normal range of motion. He exhibits mild edema and no tenderness.  Neurological: He is alert and oriented to person, place, and time. Coordination normal.  Skin: Skin is warm and dry. No rash noted. He is not diaphoretic. No erythema. No pallor.  Psychiatric: He has a normal mood and affect. His behavior is normal. Judgment and thought content normal.       ASSESSMENT AND PLAN

## 2015-09-20 NOTE — Assessment & Plan Note (Signed)
He is currently on optimal medical therapy. He appears to be euvolemic. New York Heart Association class improved to 2. He reports significant improvement after he was [facility].

## 2015-09-20 NOTE — Patient Instructions (Signed)
Medication Instructions:  Your physician recommends that you continue on your current medications as directed. Please refer to the Current Medication list given to you today.   Labwork: Fasting lipid and liver in December  Testing/Procedures: none  Follow-Up: Your physician wants you to follow-up in: six months with Dr. Kirke CorinArida. You will receive a reminder letter in the mail two months in advance. If you don't receive a letter, please call our office to schedule the follow-up appointment.   Any Other Special Instructions Will Be Listed Below (If Applicable).

## 2015-09-20 NOTE — Assessment & Plan Note (Signed)
He has no symptoms suggestive of angina. Continue medical therapy. 

## 2015-09-20 NOTE — Assessment & Plan Note (Signed)
Continue treatment with atorvastatin. I requested a fasting lipid and liver profile. 

## 2015-10-11 ENCOUNTER — Other Ambulatory Visit: Payer: Self-pay | Admitting: Cardiovascular Disease

## 2015-10-11 MED ORDER — EPLERENONE 50 MG PO TABS: ORAL_TABLET | ORAL | Status: DC

## 2015-10-31 ENCOUNTER — Encounter: Payer: Self-pay | Admitting: Cardiovascular Disease

## 2015-10-31 ENCOUNTER — Telehealth: Payer: Self-pay

## 2015-10-31 NOTE — Telephone Encounter (Signed)
Medication Samples have been provided to the patient.  Drug name: Sherryll Burgerntresto 24-26mg   Qty: one month  LOT: [id number]  Exp.Date: 4/17  The patient has been instructed regarding the correct time, dose, and frequency of taking this medication, including desired effects and most common side effects.   Shon BatonSharon H Dashley Monts 10:16 AM 10/31/2015

## 2015-11-09 ENCOUNTER — Other Ambulatory Visit: Payer: Self-pay | Admitting: *Deleted

## 2015-11-09 MED ORDER — TORSEMIDE 10 MG PO TABS: 10.0000 mg | ORAL_TABLET | ORAL | Status: DC

## 2015-11-09 NOTE — Telephone Encounter (Signed)
Requested Prescriptions   Signed Prescriptions Disp Refills  . torsemide (DEMADEX) 10 MG tablet 30 tablet 3    Sig: Take 1 tablet (10 mg total) by mouth every morning.    Authorizing Provider: Lorine BearsARIDA, MUHAMMAD A    Ordering User: Kendrick FriesLOPEZ, Annalese Stiner C

## 2015-11-12 ENCOUNTER — Telehealth: Payer: Self-pay

## 2015-11-12 NOTE — Telephone Encounter (Signed)
Prior auth for [employer]rest[employer] [id number] faxed to [employer] [place].

## 2015-11-13 ENCOUNTER — Other Ambulatory Visit: Payer: Self-pay

## 2015-11-13 MED ORDER — TORSEMIDE 10 MG PO TABS: 10.0000 mg | ORAL_TABLET | ORAL | Status: DC

## 2015-11-13 MED ORDER — EPLERENONE 50 MG PO TABS: ORAL_TABLET | ORAL | Status: DC

## 2015-11-13 NOTE — Telephone Encounter (Signed)
Refill sent for Torsemide 10 mg

## 2015-11-13 NOTE — Telephone Encounter (Signed)
Refill sent for Eplerenone 50 mg

## 2015-11-14 ENCOUNTER — Other Ambulatory Visit: Payer: Self-pay

## 2015-11-15 ENCOUNTER — Telehealth: Payer: Self-pay

## 2015-11-15 NOTE — Telephone Encounter (Signed)
Entresto approved through 11/30/2098. 

## 2015-11-20 ENCOUNTER — Other Ambulatory Visit (INDEPENDENT_AMBULATORY_CARE_PROVIDER_SITE_OTHER): Payer: BLUE CROSS/BLUE SHIELD | Admitting: *Deleted

## 2015-11-20 DIAGNOSIS — E785 Hyperlipidemia, unspecified: Secondary | ICD-10-CM | POA: Diagnosis not present

## 2015-11-21 ENCOUNTER — Encounter: Payer: BLUE CROSS/BLUE SHIELD | Admitting: *Deleted

## 2015-11-21 ENCOUNTER — Telehealth: Payer: Self-pay | Admitting: Cardiology

## 2015-11-21 LAB — LIPID PANEL
CHOLESTEROL TOTAL: 168 mg/dL (ref 100–199)
Chol/HDL Ratio: 4.9 ratio units (ref 0.0–5.0)
HDL: 34 mg/dL — ABNORMAL LOW (ref >39)
LDL Calculated: 77 mg/dL (ref 0–99)
Triglycerides: 287 mg/dL — ABNORMAL HIGH (ref 0–149)
VLDL CHOLESTEROL CAL: 57 mg/dL — AB (ref 5–40)

## 2015-11-21 NOTE — Telephone Encounter (Signed)
LMOVM reminding pt to send remote transmission.   

## 2015-11-22 ENCOUNTER — Encounter: Payer: Self-pay | Admitting: Cardiology

## 2016-01-22 ENCOUNTER — Other Ambulatory Visit: Payer: Self-pay | Admitting: Cardiovascular Disease

## 2016-03-03 ENCOUNTER — Ambulatory Visit (INDEPENDENT_AMBULATORY_CARE_PROVIDER_SITE_OTHER): Payer: BLUE CROSS/BLUE SHIELD | Admitting: Cardiovascular Disease

## 2016-03-03 ENCOUNTER — Encounter: Payer: Self-pay | Admitting: Cardiovascular Disease

## 2016-03-03 VITALS — BP 100/68 | HR 72 | Ht 71.0 in | Wt 254.0 lb

## 2016-03-03 DIAGNOSIS — I5022 Chronic systolic (congestive) heart failure: Secondary | ICD-10-CM | POA: Diagnosis not present

## 2016-03-03 DIAGNOSIS — I255 Ischemic cardiomyopathy: Secondary | ICD-10-CM

## 2016-03-03 DIAGNOSIS — I25810 Atherosclerosis of coronary artery bypass graft(s) without angina pectoris: Secondary | ICD-10-CM

## 2016-03-03 DIAGNOSIS — Z95810 Presence of automatic (implantable) cardiac defibrillator: Secondary | ICD-10-CM | POA: Diagnosis not present

## 2016-03-03 MED ORDER — EPLERENONE 25 MG PO TABS: 25.0000 mg | ORAL_TABLET | Freq: Every day | ORAL | Status: DC

## 2016-03-03 MED ORDER — TORSEMIDE 20 MG PO TABS: 20.0000 mg | ORAL_TABLET | ORAL | Status: DC

## 2016-03-03 NOTE — Patient Instructions (Addendum)
Medication Instructions:  Your physician has recommended you make the following change in your medication:  INCREASE torsemide to 20mg  daily DECREASE inspra to 25mg  daily   Labwork: BMET, liver, CBC  Testing/Procedures: Your physician has requested that you have an echocardiogram. Echocardiography is a painless test that uses sound waves to create images of your heart. It provides your doctor with information about the size and shape of your heart and how well your heart's chambers and valves are working. This procedure takes approximately one hour. There are no restrictions for this procedure.    Follow-Up: Your physician recommends that you schedule a follow-up appointment in: 2 months with Dr. Kirke CorinArida.    Any Other Special Instructions Will Be Listed Below (If Applicable). Device check/appt with Dr. Graciela HusbandsKlein     If you need a refill on your cardiac medications before your next appointment, please call your pharmacy.  Echocardiogram An echocardiogram, or echocardiography, uses sound waves (ultrasound) to produce an image of your heart. The echocardiogram is simple, painless, obtained within a short period of time, and offers valuable information to your health care provider. The images from an echocardiogram can provide information such as:  Evidence of coronary artery disease (CAD).  Heart size.  Heart muscle function.  Heart valve function.  Aneurysm detection.  Evidence of a past heart attack.  Fluid buildup around the heart.  Heart muscle thickening.  Assess heart valve function. LET [facility] PROVIDER KNOW ABOUT:  Any allergies you have.  All medicines you are taking, including vitamins, herbs, eye drops, creams, and over-the-counter medicines.  Previous problems you or members of your family have had with the use of anesthetics.  Any blood disorders you have.  Previous surgeries you have had.  Medical conditions you have.  Possibility of  pregnancy, if this applies. BEFORE THE PROCEDURE  No special preparation is needed. Eat and drink normally.  PROCEDURE   In order to produce an image of your heart, gel will be applied to your chest and a wand-like tool (transducer) will be moved over your chest. The gel will help transmit the sound waves from the transducer. The sound waves will harmlessly bounce off your heart to allow the heart images to be captured in real-time motion. These images will then be recorded.  You may need an IV to receive a medicine that improves the quality of the pictures. AFTER THE PROCEDURE You may return to your normal schedule including diet, activities, and medicines, unless your health care provider tells you otherwise.   This information is not intended to replace advice given to you by your health care provider. Make sure you discuss any questions you have with your health care provider.   Document Released: 11/14/2000 Document Revised: 12/08/2014 Document Reviewed: 07/25/2013 Elsevier Interactive Patient Education [employer]2016 Elsevier Inc.

## 2016-03-03 NOTE — Progress Notes (Signed)
Cardiology Office Note   Date:  03/03/2016   ID:  Roy Turner, DOB Mar 21, 1971, MRN [id number]  PCP:  No PCP Per Patient  Cardiologist:   Lorine BearsMuhammad Arrabella Westerman, MD   Chief Complaint  Patient presents with  . other    6 month follow up. Meds reviewed by the patient verbally. Pt. c/o LE edema & would like to know if he can increase the Torsemide to 20 mg.       History of Present Illness: Roy Buffhomas Lensing is a 45 y.o. male who presents for  a followup visit regarding coronary artery disease and chronic systolic heart failure.  He has known h/o CAD s/p STEMI with cardiogenic shock in 2012 .  He is s/p ICD [facility] [place] .   He Had anterior ST elevation myocardial infarction on 07/08/2011. cardiac catheterization showed 100% LAD occlusion, 100% RCA occlusion, and 80% LCx stenosis.  Intervention was performed but per report, this was aborted due to dissection.  He underwent CABG on 07/28/11.  2 days later, he had a VF arrest.  Emergent cath revealed patent CABG grafts but  CAD distal to graft touchdown.  He underwent implantation of a SJM Fortify ST VR ICD 08/08/11.  He has done reasonably well since that time.    He had a myoview 4/15 which revealed EF 29%.   Since his last visit in October, he developed worsening leg edema with a 12 pound weight gain in spite of no significant change in his diet. He denies chest pain but he does complain of some dizziness.  Past Medical History  Diagnosis Date  . CAD (coronary artery disease) 2012    STEMI 07/08/11  . Ischemic cardiomyopathy     St. Jude ICD  . Chronic systolic dysfunction of left ventricle   . H/O cardiac arrest     VF arrest 2 days following CABG    Past Surgical History  Procedure Laterality Date  . Coronary artery bypass graft  07/28/11    LIMA to OM, SVG to LAD, SVGF to PDA  . Cardiac defibrillator placement  08/08/11    SJM Fortify ST VR [facility] by [facility]  . [facility]  [facility]      [facility]     Current Outpatient Prescriptions  Medication Sig Dispense Refill  . allopurinol (ZYLOPRIM) 100 MG tablet Take 100 mg by mouth daily.    Marland Kitchen. aspirin 81 MG tablet Take 81 mg by mouth daily.    Marland Kitchen. atorvastatin (LIPITOR) 40 MG tablet Take 1 tablet (40 mg total) by mouth daily. 30 tablet 3  . cyclobenzaprine (FLEXERIL) 10 MG tablet Take 1 tablet (10 mg total) by mouth 3 (three) times daily as needed for muscle spasms. 90 tablet 2  . ENTRESTO 24-26 MG TAKE 1 TABLET BY MOUTH 2 (TWO) TIMES DAILY. 60 tablet 3  . eplerenone (INSPRA) 50 MG tablet TAKE 1 TABLET (50 MG TOTAL) BY MOUTH DAILY. 90 tablet 3  . magnesium oxide (MAG-OX) 400 MG tablet Take 1 tablet (400 mg total) by mouth 2 (two) times daily. 6 tablet 0  . metoprolol succinate (TOPROL-XL) 25 MG 24 hr tablet Take 1 tablet (25 mg total) by mouth daily. 30 tablet 11  . Multiple Vitamin (MULTIVITAMIN) capsule Take 1 capsule by mouth daily.    . nitroGLYCERIN (NITROSTAT) 0.4 MG SL tablet Place 1 tablet (0.4 mg total) under the tongue every 5 (five) minutes as needed for chest pain. 90  tablet 3  . Omega-3 Fatty Acids (CVS FISH OIL) 1000 MG CAPS Take 3,000 mg by mouth daily. 90 capsule   . torsemide (DEMADEX) 10 MG tablet Take 1 tablet (10 mg total) by mouth every morning. 30 tablet 6   No current facility-administered medications for this visit.    Allergies:   Penicillins    Social History:  The patient  reports that he quit smoking about 4 years ago. His smoking use included Cigarettes. He has a 15 pack-year smoking history. He does not have any smokeless tobacco history on file. He reports that he drinks alcohol. He reports that he does not use illicit drugs.   Family History:  The patient's family history includes Cancer in his father.    ROS:  Please see the history of present illness.   Otherwise, review of systems are positive for none.   All other systems are reviewed and negative.    PHYSICAL EXAM: VS:   BP 100/68 mmHg  Pulse 72  Ht  (1.803 m)  Wt 254 lb (115.214 kg)  BMI 35.44 kg/m2 , BMI Body mass index is 35.44 kg/(m^2). GEN: Well nourished, well developed, in no acute distress HEENT: normal Neck: no JVD, carotid bruits, or masses Cardiac: RRR; no murmurs, rubs, or gallops.  +2 edema bilaterally.  Respiratory:  clear to auscultation bilaterally, normal work of breathing GI: soft, nontender, nondistended, + BS MS: no deformity or atrophy Skin: warm and dry, no rash Neuro:  Strength and sensation are intact Psych: euthymic mood, full affect   EKG:  EKG is ordered today. The ekg ordered today demonstrates normal sinus rhythm with possible inferior infarct and old anteroseptal infarct.   Recent Labs: No results found for requested labs within last 365 days.    Lipid Panel    Component Value Date/Time   CHOL 168 11/20/2015 1005   TRIG 287* 11/20/2015 1005   HDL 34* 11/20/2015 1005   CHOLHDL 4.9 11/20/2015 1005   LDLCALC 77 11/20/2015 1005      Wt Readings from Last 3 Encounters:  03/03/16 254 lb (115.214 kg)  09/20/15 242 lb 4 oz (109.884 kg)  08/08/15 246 lb 12.8 oz (111.948 kg)         ASSESSMENT AND PLAN:  1.  Acute on chronic systolic heart failure: The patient appears to be volume overloaded with 12 pound weight gain since his last visit and significant leg edema. I requested routine labs today to investigate this. I elected to increase the dose of torsemide to 20 mg once daily and decrease the dose of eplerenone to 25 mg once daily. Continue other heart failure medications. Given his worsening status, I requested an echocardiogram.   2. Coronary artery disease involving native coronary arteries: Status post CABG. Currently he has no chest pain but if there is no improvement in his symptoms, we might need to consider ischemic heart evaluation.  3. Hyperlipidemia: Continue treatment with atorvastatin and fish oil. Most recent LDL was  77.     Disposition:   FU with me in 2 months  Signed,  [name], MD  03/03/2016 11:37 AM    [facility]

## 2016-03-04 LAB — BASIC METABOLIC PANEL
BUN / CREAT RATIO: 14 (ref 9–20)
BUN: 14 mg/dL (ref 6–24)
CALCIUM: 9.6 mg/dL (ref 8.7–10.2)
CHLORIDE: 99 mmol/L (ref 96–106)
CO2: 20 mmol/L (ref 18–29)
Creatinine, Ser: 1.03 mg/dL (ref 0.76–1.27)
GFR, EST AFRICAN AMERICAN: 102 mL/min/{1.73_m2} (ref >59)
GFR, EST NON AFRICAN AMERICAN: 89 mL/min/{1.73_m2} (ref >59)
Glucose: 101 mg/dL — ABNORMAL HIGH (ref 65–99)
POTASSIUM: 4.8 mmol/L (ref 3.5–5.2)
Sodium: 140 mmol/L (ref 134–144)

## 2016-03-04 LAB — HEPATIC FUNCTION PANEL
ALBUMIN: 4.6 g/dL (ref 3.5–5.5)
ALT: 25 IU/L (ref 0–44)
AST: 21 IU/L (ref 0–40)
Alkaline Phosphatase: 87 IU/L (ref 39–117)
BILIRUBIN TOTAL: 0.4 mg/dL (ref 0.0–1.2)
Bilirubin, Direct: 0.08 mg/dL (ref 0.00–0.40)
TOTAL PROTEIN: 6.7 g/dL (ref 6.0–8.5)

## 2016-03-04 LAB — CBC
HEMOGLOBIN: 14.8 g/dL (ref 12.6–17.7)
Hematocrit: 43.7 % (ref 37.5–51.0)
MCH: 30.4 pg (ref 26.6–33.0)
MCHC: 33.9 g/dL (ref 31.5–35.7)
MCV: 90 fL (ref 79–97)
PLATELETS: 194 10*3/uL (ref 150–379)
RBC: 4.87 x10E6/uL (ref 4.14–5.80)
RDW: 13.4 % (ref 12.3–15.4)
WBC: 7 10*3/uL (ref 3.4–10.8)

## 2016-03-20 ENCOUNTER — Other Ambulatory Visit: Payer: Self-pay

## 2016-03-20 ENCOUNTER — Ambulatory Visit (INDEPENDENT_AMBULATORY_CARE_PROVIDER_SITE_OTHER): Payer: BLUE CROSS/BLUE SHIELD

## 2016-03-20 DIAGNOSIS — I5022 Chronic systolic (congestive) heart failure: Secondary | ICD-10-CM

## 2016-04-23 ENCOUNTER — Other Ambulatory Visit: Payer: Self-pay

## 2016-04-23 MED ORDER — ATORVASTATIN CALCIUM 40 MG PO TABS: 40.0000 mg | ORAL_TABLET | Freq: Every day | ORAL | Status: DC

## 2016-04-23 NOTE — Telephone Encounter (Signed)
Refill sent for atorvastatin 40 mg  

## 2016-05-06 ENCOUNTER — Telehealth: Payer: Self-pay | Admitting: *Deleted

## 2016-05-06 ENCOUNTER — Encounter: Payer: Self-pay | Admitting: Internal Medicine

## 2016-05-06 ENCOUNTER — Ambulatory Visit (INDEPENDENT_AMBULATORY_CARE_PROVIDER_SITE_OTHER): Payer: BLUE CROSS/BLUE SHIELD | Admitting: Internal Medicine

## 2016-05-06 VITALS — BP 118/82 | HR 86 | Ht 71.0 in | Wt 256.2 lb

## 2016-05-06 DIAGNOSIS — I5022 Chronic systolic (congestive) heart failure: Secondary | ICD-10-CM

## 2016-05-06 DIAGNOSIS — Z95810 Presence of automatic (implantable) cardiac defibrillator: Secondary | ICD-10-CM | POA: Diagnosis not present

## 2016-05-06 DIAGNOSIS — I255 Ischemic cardiomyopathy: Secondary | ICD-10-CM | POA: Diagnosis not present

## 2016-05-06 LAB — CUP PACEART INCLINIC DEVICE CHECK
Battery Remaining Longevity: 68.4
Brady Statistic RV Percent Paced: 0 %
Date Time Interrogation Session: 20170606134043
HighPow Impedance: 44.7912
Lead Channel Pacing Threshold Amplitude: 1 V
Lead Channel Sensing Intrinsic Amplitude: 12 mV
MDC IDC LEAD IMPLANT DT: 20120907
MDC IDC LEAD LOCATION: 753860
MDC IDC MSMT LEADCHNL RV IMPEDANCE VALUE: 437.5 Ohm
MDC IDC MSMT LEADCHNL RV PACING THRESHOLD PULSEWIDTH: 0.5 ms
MDC IDC PG SERIAL: 1027081
MDC IDC SET LEADCHNL RV PACING AMPLITUDE: 2.5 V
MDC IDC SET LEADCHNL RV PACING PULSEWIDTH: 0.5 ms
MDC IDC SET LEADCHNL RV SENSING SENSITIVITY: 0.5 mV

## 2016-05-06 NOTE — Progress Notes (Signed)
Patient Care Team: No Pcp Per Patient as PCP - General (General Practice)   HPI  Roy Turner is a 45 y.o. male Seen to establish device follow-up in [place]. He has a history of cardiac arrest in the context of cardiogenic shock and acute LAD and RCA occlusion. Efforts at percutaneous intervention will complicated by dissection and he underwent bypass surgery followed 2 days later by the VF arrest. Repeat catheterization demonstrated no clear triggering event and he underwent ICD implantation.  He has been followed by Dr. Johney Frame in [place]  Since having been started on Entresto, his weight has ballooned 40 or 50 pounds. He has felt much stronger. This is persistent despite exercising 30 minutes 6 days a week.  Records and Results Reviewed old office records. Myoview scan 4/15 ejection fraction 29%. Echocardiogram 4/17 EF 25-30% with mild LAE ECG 4/17 narrow QRS  Past Medical History  Diagnosis Date  . CAD (coronary artery disease) 2012    STEMI 07/08/11  . Ischemic cardiomyopathy     St. Jude ICD  . Chronic systolic dysfunction of left ventricle   . H/O cardiac arrest     VF arrest 2 days following CABG    Past Surgical History  Procedure Laterality Date  . Coronary artery bypass graft  07/28/11    LIMA to OM, SVG to LAD, SVGF to PDA  . Cardiac defibrillator placement  08/08/11    SJM Fortify ST VR [facility]  . Cardiac catheterization      [facility]    Current Outpatient Prescriptions  Medication Sig Dispense Refill  . aspirin 81 MG tablet Take 81 mg by mouth daily.    Marland Kitchen atorvastatin (LIPITOR) 40 MG tablet Take 1 tablet (40 mg total) by mouth daily. 30 tablet 3  . cyclobenzaprine (FLEXERIL) 10 MG tablet Take 1 tablet (10 mg total) by mouth 3 (three) times daily as needed for muscle spasms. 90 tablet 2  . ENTRESTO 24-26 MG TAKE 1 TABLET BY MOUTH 2 (TWO) TIMES DAILY. 60 tablet 3  . eplerenone (INSPRA) 25 MG tablet Take 1  tablet (25 mg total) by mouth daily. 30 tablet 5  . magnesium oxide (MAG-OX) 400 MG tablet Take 1 tablet (400 mg total) by mouth 2 (two) times daily. 6 tablet 0  . metoprolol succinate (TOPROL-XL) 25 MG 24 hr tablet Take 1 tablet (25 mg total) by mouth daily. 30 tablet 11  . Multiple Vitamin (MULTIVITAMIN) capsule Take 1 capsule by mouth daily.    . nitroGLYCERIN (NITROSTAT) 0.4 MG SL tablet Place 1 tablet (0.4 mg total) under the tongue every 5 (five) minutes as needed for chest pain. 90 tablet 3  . Omega-3 Fatty Acids (CVS FISH OIL) 1000 MG CAPS Take 3,000 mg by mouth daily. 90 capsule   . torsemide (DEMADEX) 20 MG tablet Take 1 tablet (20 mg total) by mouth every morning. 30 tablet 3   No current facility-administered medications for this visit.    Allergies  Allergen Reactions  . Penicillins Hives    As a child      Review of Systems negative except from HPI and PMH  Physical Exam BP 118/82 mmHg  Pulse 86  Ht  (1.803 m)  Wt 256 lb 4 oz (116.234 kg)  BMI 35.76 kg/m2 Well developed and well nourished in no acute distress HENT normal E scleral and icterus clear Neck Supple JVP flat; carotids brisk and full Clear to ausculation Device  pocket well healed; without hematoma or erythema.  There is no tethering Regular rate and rhythm, no murmurs gallops or rub Soft with active bowel sounds No clubbing cyanosis  Edema Alert and oriented, grossly normal motor and sensory function Skin Warm and Dry    Assessment and  Plan  Ischemic cardiomyopathy  Morbid obesity  Congestive heart failure-chronic-systolic  Orthostatic lightheadedness  Implantable defibrillator St. Jude The patient's device was interrogated.  The information was reviewed. No changes were made in the programming.     We have reviewed the benefits of afterload reduction and so we will continue him on his guidelines directed therapy  We have reviewed the physiology of orthostatic lightheadedness; he  will take showers at night. He will take his eplerenone at night.  We also discussed his morbid obesity. It may well be that his appetite increased with the improvement in his heart failure introduction of Entresto. I suggested he consider [place] [place] diet.

## 2016-05-06 NOTE — Patient Instructions (Signed)
Medication Instructions: - Your physician recommends that you continue on your current medications as directed. Please refer to the Current Medication list given to you today.  Labwork: - none  Procedures/Testing: - none  Follow-Up: - Remote monitoring is used to monitor your Pacemaker of ICD from home. This monitoring reduces the number of office visits required to check your device to one time per year. It allows us to keep an eye on the functioning of your device to ensure it is working properly. You are scheduled for a device check from home on 08/05/16. You may send your transmission at any time that day. If you have a wireless device, the transmission will be sent automatically. After your physician reviews your transmission, you will receive a postcard with your next transmission date.  - Your physician wants you to follow-up in: 1 year with Dr. Klein. You will receive a reminder letter in the mail two months in advance. If you don't receive a letter, please call our office to schedule the follow-up appointment.  Any Additional Special Instructions Will Be Listed Below (If Applicable).     If you need a refill on your cardiac medications before your next appointment, please call your pharmacy.   

## 2016-05-06 NOTE — Telephone Encounter (Signed)
Called patient to update him that the [facility] Clinical [facility]ogeneticist[facility]ude programmer in [place] required a software update and that this is the reason why we needed a password to interrogate his ICD this morning.  Advised patient that the software has since been updated per industry rep and that he does not need to be concerned.  Patient verbalizes understanding and appreciation.  He denies additional questions or concerns at this time.

## 2016-05-12 ENCOUNTER — Ambulatory Visit (INDEPENDENT_AMBULATORY_CARE_PROVIDER_SITE_OTHER): Payer: BLUE CROSS/BLUE SHIELD | Admitting: Cardiovascular Disease

## 2016-05-12 ENCOUNTER — Encounter: Payer: Self-pay | Admitting: Cardiovascular Disease

## 2016-05-12 VITALS — BP 136/92 | HR 70 | Ht 71.0 in | Wt 251.5 lb

## 2016-05-12 DIAGNOSIS — I25810 Atherosclerosis of coronary artery bypass graft(s) without angina pectoris: Secondary | ICD-10-CM

## 2016-05-12 DIAGNOSIS — Z95810 Presence of automatic (implantable) cardiac defibrillator: Secondary | ICD-10-CM

## 2016-05-12 DIAGNOSIS — I5022 Chronic systolic (congestive) heart failure: Secondary | ICD-10-CM

## 2016-05-12 MED ORDER — CARVEDILOL 6.25 MG PO TABS: 6.2500 mg | ORAL_TABLET | Freq: Two times a day (BID) | ORAL | Status: DC

## 2016-05-12 NOTE — Progress Notes (Signed)
Cardiology Office Note   Date:  05/12/2016   ID:  Roy Turner, DOB [birth year], MRN [id number]  PCP:  No PCP Per Patient  Cardiologist:   [name], MD   Chief Complaint  Patient presents with  . other    2 month follow up. Pt. c/o LE edema with occas. dizziness. Meds reviewed by the patient verbally.       History of Present Illness: Roy Turner is a 45 y.o. male who presents for  a followup visit regarding coronary artery disease and chronic systolic heart failure.  He has known h/o CAD s/p STEMI with cardiogenic shock in 2012 .  He is s/p ICD implant at [facility] in [place] .   He Had anterior ST elevation myocardial infarction on 07/08/2011. cardiac catheterization showed 100% LAD occlusion, 100% RCA occlusion, and 80% LCx stenosis.  Intervention was performed but per report, this was aborted due to dissection.  He underwent CABG on 07/28/11.  2 days later, he had a VF arrest.  Emergent cath revealed patent CABG grafts but  CAD distal to graft touchdown.  He underwent implantation of a SJM Fortify ST VR ICD 08/08/11.  He has done reasonably well since that time.    He had a myoview 4/15 which revealed EF 29%.   He was seen recently for worsening shortness of breath, leg edema and weight gain. I increased the dose of torsemide and decreased eplerenone. An echocardiogram was done which showed an ejection fraction of 25-30% with wall motion abnormalities consistent with his primary myocardial infarction. He is overall feeling a little bit better. He started the [employer] at the recommendation of Dr. Graciela Husbands. No chest pain.  Past Medical History  Diagnosis Date  . CAD (coronary artery disease) 2012    STEMI 07/08/11  . Ischemic cardiomyopathy     St. Jude ICD  . Chronic systolic dysfunction of left ventricle   . H/O cardiac arrest     VF arrest 2 days following CABG    Past Surgical History  Procedure Laterality Date  . Coronary artery bypass graft   07/28/11    LIMA to OM, SVG to LAD, SVGF to PDA  . Cardiac defibrillator placement  08/08/11    SJM Fortify ST VR implanted by Dr Selena Batten at [facility]  . Cardiac catheterization      [facility]     Current Outpatient Prescriptions  Medication Sig Dispense Refill  . aspirin 81 MG tablet Take 81 mg by mouth daily.    Marland Kitchen atorvastatin (LIPITOR) 40 MG tablet Take 1 tablet (40 mg total) by mouth daily. 30 tablet 3  . cyclobenzaprine (FLEXERIL) 10 MG tablet Take 1 tablet (10 mg total) by mouth 3 (three) times daily as needed for muscle spasms. 90 tablet 2  . ENTRESTO 24-26 MG TAKE 1 TABLET BY MOUTH 2 (TWO) TIMES DAILY. 60 tablet 3  . eplerenone (INSPRA) 25 MG tablet Take 1 tablet (25 mg total) by mouth daily. 30 tablet 5  . magnesium oxide (MAG-OX) 400 MG tablet Take 1 tablet (400 mg total) by mouth 2 (two) times daily. 6 tablet 0  . metoprolol succinate (TOPROL-XL) 25 MG 24 hr tablet Take 1 tablet (25 mg total) by mouth daily. 30 tablet 11  . Multiple Vitamin (MULTIVITAMIN) capsule Take 1 capsule by mouth daily.    . nitroGLYCERIN (NITROSTAT) 0.4 MG SL tablet Place 1 tablet (0.4 mg total) under the tongue every 5 (five) minutes as needed  for chest pain. 90 tablet 3  . Omega-3 Fatty Acids (CVS FISH OIL) 1000 MG CAPS Take 3,000 mg by mouth daily. 90 capsule   . torsemide (DEMADEX) 20 MG tablet Take 1 tablet (20 mg total) by mouth every morning. 30 tablet 3   No current facility-administered medications for this visit.    Allergies:   Penicillins    Social History:  The patient  reports that he quit smoking about 4 years ago. His smoking use included Cigarettes. He has a 15 pack-year smoking history. He does not have any smokeless tobacco history on file. He reports that he drinks alcohol. He reports that he does not use illicit drugs.   Family History:  The patient's family history includes Cancer in his father.    ROS:  Please see the history of present illness.   Otherwise,  review of systems are positive for none.   All other systems are reviewed and negative.    PHYSICAL EXAM: VS:  BP 136/92 mmHg  Pulse 70  Ht 5\' 11"  (1.803 m)  Wt 251 lb 8 oz (114.08 kg)  BMI 35.09 kg/m2 , BMI Body mass index is 35.09 kg/(m^2). GEN: Well nourished, well developed, in no acute distress HEENT: normal Neck: no JVD, carotid bruits, or masses Cardiac: RRR; no murmurs, rubs, or gallops.  +1 edema bilaterally.  Respiratory:  clear to auscultation bilaterally, normal work of breathing GI: soft, nontender, nondistended, + BS MS: no deformity or atrophy Skin: warm and dry, no rash Neuro:  Strength and sensation are intact Psych: euthymic mood, full affect   EKG:  EKG is not ordered today.    Recent Labs: 03/03/2016: ALT 25; BUN 14; Creatinine, Ser 1.03; Platelets 194; Potassium 4.8; Sodium 140    Lipid Panel    Component Value Date/Time   CHOL 168 11/20/2015 1005   TRIG 287* 11/20/2015 1005   HDL 34* 11/20/2015 1005   CHOLHDL 4.9 11/20/2015 1005   LDLCALC 77 11/20/2015 1005      Wt Readings from Last 3 Encounters:  05/12/16 251 lb 8 oz (114.08 kg)  05/06/16 256 lb 4 oz (116.234 kg)  03/03/16 254 lb (115.214 kg)         ASSESSMENT AND PLAN:  1.  chronic systolic heart failure:  Echocardiogram showed stable LV systolic function. He improved with increasing the dose of torsemide. I discussed with him the importance of continued attempt of weight loss. I also elected to switch him from metoprolol to carvedilol. He is otherwise on optimal medical therapy.   2. Coronary artery disease involving native coronary arteries: Status post CABG. given improvement in symptoms with gentle diuresis, I elected not to pursue further ischemic evaluation.   3. Hyperlipidemia: Continue treatment with atorvastatin and fish oil. Most recent LDL was 77.     Disposition:   FU with me in 4 months  Signed,  Lorine BearsMuhammad Arida, MD  05/12/2016 11:44 AM    [facility]

## 2016-05-12 NOTE — Patient Instructions (Signed)
Medication Instructions:  Your physician has recommended you make the following change in your medication:  STOP taking metoprolol START taking coreg 6.25mg  twice daily   Labwork: none  Testing/Procedures: none  Follow-Up: Your physician recommends that you schedule a follow-up appointment in: 4 months with Dr. Kirke CorinArida.    Any Other Special Instructions Will Be Listed Below (If Applicable).     If you need a refill on your cardiac medications before your next appointment, please call your pharmacy.

## 2016-05-13 ENCOUNTER — Encounter: Payer: Self-pay | Admitting: Internal Medicine

## 2016-05-21 ENCOUNTER — Other Ambulatory Visit: Payer: Self-pay | Admitting: Cardiovascular Disease

## 2016-07-02 ENCOUNTER — Other Ambulatory Visit: Payer: Self-pay | Admitting: Cardiovascular Disease

## 2016-08-05 ENCOUNTER — Telehealth: Payer: Self-pay | Admitting: Cardiology

## 2016-08-05 ENCOUNTER — Ambulatory Visit (INDEPENDENT_AMBULATORY_CARE_PROVIDER_SITE_OTHER): Payer: BLUE CROSS/BLUE SHIELD | Admitting: *Deleted

## 2016-08-05 DIAGNOSIS — Z95810 Presence of automatic (implantable) cardiac defibrillator: Secondary | ICD-10-CM

## 2016-08-05 DIAGNOSIS — I255 Ischemic cardiomyopathy: Secondary | ICD-10-CM | POA: Diagnosis not present

## 2016-08-05 NOTE — Telephone Encounter (Signed)
Spoke with pt and reminded pt of remote transmission that is due today. Pt verbalized understanding.   

## 2016-08-05 NOTE — Progress Notes (Signed)
Remote ICD transmission.   

## 2016-08-08 ENCOUNTER — Encounter: Payer: Self-pay | Admitting: Cardiology

## 2016-08-13 LAB — CUP PACEART REMOTE DEVICE CHECK
Battery Voltage: 2.95 V
Brady Statistic RV Percent Paced: 1 %
Date Time Interrogation Session: 20170905161714
HIGH POWER IMPEDANCE MEASURED VALUE: 43 Ohm
Implantable Lead Implant Date: 20120907
Lead Channel Impedance Value: 430 Ohm
Lead Channel Pacing Threshold Amplitude: 1 V
Lead Channel Pacing Threshold Pulse Width: 0.5 ms
MDC IDC LEAD LOCATION: 753860
MDC IDC MSMT BATTERY REMAINING LONGEVITY: 68 mo
MDC IDC MSMT BATTERY REMAINING PERCENTAGE: 60 %
MDC IDC MSMT LEADCHNL RV SENSING INTR AMPL: 12 mV
MDC IDC SET LEADCHNL RV PACING AMPLITUDE: 2.5 V
MDC IDC SET LEADCHNL RV PACING PULSEWIDTH: 0.5 ms
MDC IDC SET LEADCHNL RV SENSING SENSITIVITY: 0.5 mV
Pulse Gen Serial Number: 1027081

## 2016-09-10 ENCOUNTER — Other Ambulatory Visit: Payer: Self-pay | Admitting: Cardiovascular Disease

## 2016-09-11 ENCOUNTER — Ambulatory Visit (INDEPENDENT_AMBULATORY_CARE_PROVIDER_SITE_OTHER): Payer: BLUE CROSS/BLUE SHIELD | Admitting: Cardiovascular Disease

## 2016-09-11 ENCOUNTER — Encounter: Payer: Self-pay | Admitting: Cardiovascular Disease

## 2016-09-11 VITALS — BP 92/60 | HR 68 | Ht 72.0 in | Wt 234.5 lb

## 2016-09-11 DIAGNOSIS — I25810 Atherosclerosis of coronary artery bypass graft(s) without angina pectoris: Secondary | ICD-10-CM | POA: Diagnosis not present

## 2016-09-11 DIAGNOSIS — I5022 Chronic systolic (congestive) heart failure: Secondary | ICD-10-CM | POA: Diagnosis not present

## 2016-09-11 DIAGNOSIS — I25708 Atherosclerosis of coronary artery bypass graft(s), unspecified, with other forms of angina pectoris: Secondary | ICD-10-CM | POA: Diagnosis not present

## 2016-09-11 DIAGNOSIS — E7800 Pure hypercholesterolemia, unspecified: Secondary | ICD-10-CM | POA: Diagnosis not present

## 2016-09-11 NOTE — Progress Notes (Signed)
Cardiology Office Note   Date:  09/11/2016   ID:  Roy Turner, DOB 1971/03/12, MRN [id number]  PCP:  No PCP Per Patient  Cardiologist:   [name], MD   Chief Complaint  Patient presents with  . other    4 month follow up. Meds reviewed by the pt. verbally. "doing well."       History of Present Illness: Roy Turner is a 45 y.o. male who presents for  a followup visit regarding coronary artery disease and chronic systolic heart failure.  He has known h/o CAD s/p STEMI with cardiogenic shock in 2012 .  He Had anterior ST elevation myocardial infarction on 07/08/2011. cardiac catheterization showed 100% LAD occlusion, 100% RCA occlusion, and 80% LCx stenosis.  Intervention was performed but per report, this was aborted due to dissection.  He underwent CABG on 07/28/11.  2 days later, he had a VF arrest.  Emergent cath revealed patent CABG grafts but  CAD distal to graft touchdown.  He underwent implantation of a SJM Fortify ST VR ICD 08/08/11.  He has done reasonably well since that time.    He had a myoview 4/15 which revealed EF 29%.   Most recent echocardiogram in April 2017 showed an ejection fraction of 25-30% with akinesis of the anteroseptal, anterior, anterolateral and apical myocardium. During last visit, I switched him from metoprolol to carvedilol.He has been doing very well overall and lost 15 pounds by trying a new low-carb diet. He has occasional orthostatic dizziness but that has been a chronic issue for him. He exercises regularly and sometimes he gets chest tightness at the end of 30 minutes of exercise.    Past Medical History:  Diagnosis Date  . CAD (coronary artery disease) 2012   STEMI 07/08/11  . Chronic systolic dysfunction of left ventricle   . H/O cardiac arrest    VF arrest 2 days following CABG  . Ischemic cardiomyopathy    St. Jude ICD    Past Surgical History:  Procedure Laterality Date  . CARDIAC CATHETERIZATION     [place]  . CARDIAC  DEFIBRILLATOR PLACEMENT  08/08/11   SJM Fortify ST VR implanted by Dr Selena Batten at [facility]  . CORONARY ARTERY BYPASS GRAFT  07/28/11   LIMA to OM, SVG to LAD, SVGF to PDA     Current Outpatient Prescriptions  Medication Sig Dispense Refill  . aspirin 81 MG tablet Take 81 mg by mouth daily.    Marland Kitchen atorvastatin (LIPITOR) 40 MG tablet Take 1 tablet (40 mg total) by mouth daily. 30 tablet 3  . carvedilol (COREG) 6.25 MG tablet Take 1 tablet (6.25 mg total) by mouth 2 (two) times daily. 60 tablet 5  . cyclobenzaprine (FLEXERIL) 10 MG tablet Take 1 tablet (10 mg total) by mouth 3 (three) times daily as needed for muscle spasms. 90 tablet 2  . ENTRESTO 24-26 MG TAKE 1 TABLET BY MOUTH 2 (TWO) TIMES DAILY. 60 tablet 3  . eplerenone (INSPRA) 25 MG tablet Take 1 tablet (25 mg total) by mouth daily. 30 tablet 5  . magnesium oxide (MAG-OX) 400 MG tablet Take 1 tablet (400 mg total) by mouth 2 (two) times daily. 6 tablet 0  . Multiple Vitamin (MULTIVITAMIN) capsule Take 1 capsule by mouth daily.    . nitroGLYCERIN (NITROSTAT) 0.4 MG SL tablet Place 1 tablet (0.4 mg total) under the tongue every 5 (five) minutes as needed for chest pain. 90 tablet 3  . Omega-3 Fatty Acids (  [employer] FISH OIL) 1000 MG CAPS Take 3,000 mg by mouth daily. 90 capsule   . torsemide (DEMADEX) 20 MG tablet TAKE 1 TABLET (20 MG TOTAL) BY MOUTH EVERY MORNING. 30 tablet 3   No current facility-administered medications for this visit.     Allergies:   Penicillins    Social History:  The patient  reports that he quit smoking about 5 years ago. His smoking use included Cigarettes. He has a 15.00 pack-year smoking history. He has never used smokeless tobacco. He reports that he drinks alcohol. He reports that he does not use drugs.   Family History:  The patient's family history includes Cancer in his father.    ROS:  Please see the history of present illness.   Otherwise, review of systems are positive for none.   All other  systems are reviewed and negative.    PHYSICAL EXAM: VS:  BP 92/60 (BP Location: Left Arm, Patient Position: Sitting, Cuff Size: Normal)   Pulse 68   Ht 6' (1.829 m)   Wt 234 lb 8 oz (106.4 kg)   BMI 31.80 kg/m  , BMI Body mass index is 31.8 kg/m. GEN: Well nourished, well developed, in no acute distress  HEENT: normal  Neck: no JVD, carotid bruits, or masses Cardiac: RRR; no murmurs, rubs, or gallops.  +1 edema bilaterally.  Respiratory:  clear to auscultation bilaterally, normal work of breathing GI: soft, nontender, nondistended, + BS MS: no deformity or atrophy  Skin: warm and dry, no rash Neuro:  Strength and sensation are intact Psych: euthymic mood, full affect   EKG:  EKG is ordered today. EKG showed normal sinus rhythm with old anteroseptal infarct.   Recent Labs: 03/03/2016: ALT 25; BUN 14; Creatinine, Ser 1.03; Platelets 194; Potassium 4.8; Sodium 140    Lipid Panel    Component Value Date/Time   CHOL 168 11/20/2015 1005   TRIG 287 (H) 11/20/2015 1005   HDL 34 (L) 11/20/2015 1005   CHOLHDL 4.9 11/20/2015 1005   LDLCALC 77 11/20/2015 1005      Wt Readings from Last 3 Encounters:  09/11/16 234 lb 8 oz (106.4 kg)  05/12/16 251 lb 8 oz (114.1 kg)  05/06/16 256 lb 4 oz (116.2 kg)         ASSESSMENT AND PLAN:  1.  chronic systolic heart failure:  He appears to be euvolemic on small dose of torsemide. He is on optimal medical therapy. He is mildly hypotensive and does have chronic orthostatic dizziness. Thus, we cannot uptitrate any of his medications. I requested basic metabolic profile.   2. Coronary artery disease involving native coronary arteries with mild stable angina:  No significant change in symptoms. Continue medical therapy.   3. Hyperlipidemia: Continue treatment with atorvastatin and fish oil. Most recent LDL was 77.   Disposition:   FU with me in 6 months  Signed,  Lorine BearsMuhammad Kara Melching, MD  09/11/2016 10:08 AM    [facility]

## 2016-09-11 NOTE — Patient Instructions (Signed)
Medication Instructions: Continue same medications.   Labwork: BMP today.   Procedures/Testing: None.   Follow-Up: 6 months with Dr. Kirke CorinArida.   Any Additional Special Instructions Will Be Listed Below (If Applicable).     If you need a refill on your cardiac medications before your next appointment, please call your pharmacy.

## 2016-09-12 LAB — BASIC METABOLIC PANEL
BUN / CREAT RATIO: 18 (ref 9–20)
BUN: 19 mg/dL (ref 6–24)
CALCIUM: 9.5 mg/dL (ref 8.7–10.2)
CHLORIDE: 95 mmol/L — AB (ref 96–106)
CO2: 24 mmol/L (ref 18–29)
CREATININE: 1.07 mg/dL (ref 0.76–1.27)
GFR calc Af Amer: 97 mL/min/{1.73_m2} (ref >59)
GFR calc non Af Amer: 84 mL/min/{1.73_m2} (ref >59)
GLUCOSE: 103 mg/dL — AB (ref 65–99)
Potassium: 4 mmol/L (ref 3.5–5.2)
Sodium: 138 mmol/L (ref 134–144)

## 2016-09-23 ENCOUNTER — Other Ambulatory Visit: Payer: Self-pay | Admitting: Cardiovascular Disease

## 2016-10-20 ENCOUNTER — Encounter: Payer: Self-pay | Admitting: Cardiovascular Disease

## 2016-10-20 ENCOUNTER — Telehealth: Payer: Self-pay | Admitting: *Deleted

## 2016-10-20 NOTE — Telephone Encounter (Signed)
Received request for Entresto samples from patient via MyChart. See patient email.  Medication Samples have been provided to the patient.  Drug name: Sherryll BurgerEntresto       Strength: 24mg /26mg         Qty: 3 boxes (84 tablets)  LOT: [id number]  Exp.Date: Jan 2018  Samples left at front desk for patient to pick. Replied to patient email to notify him samples are ready.

## 2016-10-26 ENCOUNTER — Other Ambulatory Visit: Payer: Self-pay | Admitting: Cardiovascular Disease

## 2016-11-04 ENCOUNTER — Ambulatory Visit (INDEPENDENT_AMBULATORY_CARE_PROVIDER_SITE_OTHER): Payer: BLUE CROSS/BLUE SHIELD | Admitting: *Deleted

## 2016-11-04 DIAGNOSIS — I255 Ischemic cardiomyopathy: Secondary | ICD-10-CM

## 2016-11-04 NOTE — Progress Notes (Signed)
Remote ICD transmission.   

## 2016-11-05 ENCOUNTER — Other Ambulatory Visit: Payer: Self-pay | Admitting: Cardiovascular Disease

## 2016-11-12 ENCOUNTER — Encounter: Payer: Self-pay | Admitting: Cardiology

## 2016-11-16 ENCOUNTER — Other Ambulatory Visit: Payer: Self-pay | Admitting: Cardiovascular Disease

## 2016-11-26 ENCOUNTER — Encounter: Payer: Self-pay | Admitting: Cardiology

## 2016-11-28 LAB — CUP PACEART REMOTE DEVICE CHECK
Battery Remaining Longevity: 66 mo
Battery Remaining Percentage: 58 %
Brady Statistic RV Percent Paced: 1 %
Date Time Interrogation Session: 20171205090023
HIGH POWER IMPEDANCE MEASURED VALUE: 47 Ohm
Implantable Lead Location: 753860
Lead Channel Impedance Value: 430 Ohm
Lead Channel Sensing Intrinsic Amplitude: 12 mV
Lead Channel Setting Pacing Amplitude: 2.5 V
Lead Channel Setting Pacing Pulse Width: 0.5 ms
MDC IDC LEAD IMPLANT DT: 20120907
MDC IDC MSMT BATTERY VOLTAGE: 2.95 V
MDC IDC MSMT LEADCHNL RV PACING THRESHOLD AMPLITUDE: 1 V
MDC IDC MSMT LEADCHNL RV PACING THRESHOLD PULSEWIDTH: 0.5 ms
MDC IDC PG IMPLANT DT: 20120907
MDC IDC PG SERIAL: 1027081
MDC IDC SET LEADCHNL RV SENSING SENSITIVITY: 0.5 mV

## 2017-01-05 ENCOUNTER — Telehealth: Payer: Self-pay | Admitting: Cardiovascular Disease

## 2017-01-05 NOTE — Telephone Encounter (Signed)
PA request for Entresto 24-26mg  submitted. PA case ID: [id number]

## 2017-01-19 ENCOUNTER — Telehealth: Payer: Self-pay

## 2017-01-19 NOTE — Telephone Encounter (Signed)
Patient notified that [facility] has approved the Entresto 24-26 mg from start date of 01/05/2017 through 01/05/2018.

## 2017-01-31 ENCOUNTER — Other Ambulatory Visit: Payer: Self-pay | Admitting: Cardiovascular Disease

## 2017-02-02 ENCOUNTER — Encounter: Payer: Self-pay | Admitting: Cardiovascular Disease

## 2017-02-02 ENCOUNTER — Other Ambulatory Visit: Payer: Self-pay

## 2017-02-02 MED ORDER — ATORVASTATIN CALCIUM 40 MG PO TABS: 40.0000 mg | ORAL_TABLET | Freq: Every day | ORAL | 3 refills | Status: DC

## 2017-02-27 ENCOUNTER — Other Ambulatory Visit: Payer: Self-pay

## 2017-02-27 MED ORDER — TORSEMIDE 20 MG PO TABS: 20.0000 mg | ORAL_TABLET | ORAL | 3 refills | Status: DC

## 2017-02-27 MED ORDER — CARVEDILOL 6.25 MG PO TABS: 6.2500 mg | ORAL_TABLET | Freq: Two times a day (BID) | ORAL | 3 refills | Status: DC

## 2017-02-27 NOTE — Telephone Encounter (Signed)
Refill sent for Carvedilol.  

## 2017-03-13 ENCOUNTER — Other Ambulatory Visit: Payer: Self-pay

## 2017-03-13 ENCOUNTER — Encounter: Payer: Self-pay | Admitting: Cardiovascular Disease

## 2017-03-13 MED ORDER — SACUBITRIL-VALSARTAN 24-26 MG PO TABS: ORAL_TABLET | ORAL | 1 refills | Status: DC

## 2017-04-03 ENCOUNTER — Ambulatory Visit (INDEPENDENT_AMBULATORY_CARE_PROVIDER_SITE_OTHER): Payer: BC Managed Care – PPO | Admitting: Cardiovascular Disease

## 2017-04-03 ENCOUNTER — Encounter: Payer: Self-pay | Admitting: Cardiovascular Disease

## 2017-04-03 VITALS — BP 90/54 | HR 63 | Ht 72.0 in | Wt 222.5 lb

## 2017-04-03 DIAGNOSIS — E782 Mixed hyperlipidemia: Secondary | ICD-10-CM | POA: Diagnosis not present

## 2017-04-03 DIAGNOSIS — I5022 Chronic systolic (congestive) heart failure: Secondary | ICD-10-CM | POA: Diagnosis not present

## 2017-04-03 DIAGNOSIS — I25118 Atherosclerotic heart disease of native coronary artery with other forms of angina pectoris: Secondary | ICD-10-CM

## 2017-04-03 MED ORDER — EPLERENONE 25 MG PO TABS: 25.0000 mg | ORAL_TABLET | Freq: Every day | ORAL | 5 refills | Status: DC

## 2017-04-03 NOTE — Patient Instructions (Signed)
Medication Instructions:  Your physician has recommended you make the following change in your medication:  DECREASE inspra to 25mg  once daily   Labwork: BMET, liver and lipid profile  Testing/Procedures: none  Follow-Up: Your physician wants you to follow-up in: 6 months with Dr. Kirke CorinArida.  You will receive a reminder letter in the mail two months in advance. If you don't receive a letter, please call our office to schedule the follow-up appointment.   Any Other Special Instructions Will Be Listed Below (If Applicable).     If you need a refill on your cardiac medications before your next appointment, please call your pharmacy.

## 2017-04-03 NOTE — Progress Notes (Signed)
Cardiology Office Note   Date:  04/03/2017   ID:  Roy Buffhomas Waldridge, DOB [birth year], MRN [id number]  PCP:  No PCP Per Patient  Cardiologist:   Lorine BearsMuhammad Nathasha Fiorillo, MD   Chief Complaint  Patient presents with  . other    6 month follow up. Meds reviewed by the pt. verbally. Pt. c/o chest pain at times.       History of Present Illness: Roy Turner is a 46 y.o. male who presents for  a followup visit regarding coronary artery disease and chronic systolic heart failure.  He has known h/o CAD s/p anterior STEMI with cardiogenic shock in 2012 .  cardiac catheterization showed 100% LAD occlusion, 100% RCA occlusion, and 80% LCx stenosis.  Intervention was performed but per report, this was aborted due to dissection.  He underwent CABG on 07/28/11.  2 days later, he had a VF arrest.  Emergent cath revealed patent CABG grafts but CAD distal to graft touchdown.  He underwent implantation of a SJM Fortify ST VR ICD 08/08/11.  He has done reasonably well since that time.    He had a myoview 4/15 which revealed EF 29%.   Most recent echocardiogram in April 2017 showed an ejection fraction of 25-30% with akinesis of the anteroseptal, anterior, anterolateral and apical myocardium.  He has been doing well with no worsening shortness of breath. He reports stable exertional chest pain with over exertion. No orthopnea or PND. He has chronic dizziness with low blood pressure. He has been following a low carbohydrate diet and lost 25 pounds since last year.   Past Medical History:  Diagnosis Date  . CAD (coronary artery disease) 2012   STEMI 07/08/11  . Chronic systolic dysfunction of left ventricle   . H/O cardiac arrest    VF arrest 2 days following CABG  . Ischemic cardiomyopathy    St. Jude ICD    Past Surgical History:  Procedure Laterality Date  . CARDIAC CATHETERIZATION     [place]  . CARDIAC DEFIBRILLATOR PLACEMENT  08/08/11   SJM Fortify ST VR [facility]  .  CORONARY ARTERY BYPASS GRAFT  07/28/11   LIMA to OM, SVG to LAD, SVGF to PDA     Current Outpatient Prescriptions  Medication Sig Dispense Refill  . aspirin 81 MG tablet Take 81 mg by mouth daily.    Marland Kitchen. atorvastatin (LIPITOR) 40 MG tablet Take 1 tablet (40 mg total) by mouth daily. 90 tablet 3  . carvedilol (COREG) 6.25 MG tablet Take 1 tablet (6.25 mg total) by mouth 2 (two) times daily. 180 tablet 3  . cyclobenzaprine (FLEXERIL) 10 MG tablet Take 1 tablet (10 mg total) by mouth 3 (three) times daily as needed for muscle spasms. 90 tablet 2  . eplerenone (INSPRA) 25 MG tablet Take 1 tablet (25 mg total) by mouth daily. 30 tablet 5  . eplerenone (INSPRA) 50 MG tablet TAKE 1 TABLET (50 MG TOTAL) BY MOUTH DAILY. 90 tablet 1  . magnesium oxide (MAG-OX) 400 MG tablet Take 1 tablet (400 mg total) by mouth 2 (two) times daily. 6 tablet 0  . Multiple Vitamin (MULTIVITAMIN) capsule Take 1 capsule by mouth daily.    . nitroGLYCERIN (NITROSTAT) 0.4 MG SL tablet Place 1 tablet (0.4 mg total) under the tongue every 5 (five) minutes as needed for chest pain. 90 tablet 3  . Omega-3 Fatty Acids ([employer] FISH OIL) 1000 MG CAPS Take 3,000 mg by mouth daily. 90  capsule   . sacubitril-valsartan (ENTRESTO) 24-26 MG TAKE 1 TABLET BY MOUTH 2 (TWO) TIMES DAILY. 180 tablet 1  . torsemide (DEMADEX) 20 MG tablet Take 1 tablet (20 mg total) by mouth every morning. 90 tablet 3   No current facility-administered medications for this visit.     Allergies:   Penicillins    Social History:  The patient  reports that he quit smoking about 5 years ago. His smoking use included Cigarettes. He has a 15.00 pack-year smoking history. He has never used smokeless tobacco. He reports that he drinks alcohol. He reports that he does not use drugs.   Family History:  The patient's family history includes Cancer in his father.    ROS:  Please see the history of present illness.   Otherwise, review of systems are positive for none.    All other systems are reviewed and negative.    PHYSICAL EXAM: VS:  BP (!) 90/54 (BP Location: Left Arm, Patient Position: Sitting, Cuff Size: Normal)   Pulse 63   Ht 6' (1.829 m)   Wt 222 lb 8 oz (100.9 kg)   BMI 30.18 kg/m  , BMI Body mass index is 30.18 kg/m. GEN: Well nourished, well developed, in no acute distress  HEENT: normal  Neck: no JVD, carotid bruits, or masses Cardiac: RRR; no murmurs, rubs, or gallops.  +1 edema bilaterally.  Respiratory:  clear to auscultation bilaterally, normal work of breathing GI: soft, nontender, nondistended, + BS MS: no deformity or atrophy  Skin: warm and dry, no rash Neuro:  Strength and sensation are intact Psych: euthymic mood, full affect   EKG:  EKG is ordered today. EKG showed normal sinus rhythm with old anteroseptal infarct.   Recent Labs: 09/11/2016: BUN 19; Creatinine, Ser 1.07; Potassium 4.0; Sodium 138    Lipid Panel    Component Value Date/Time   CHOL 168 11/20/2015 1005   TRIG 287 (H) 11/20/2015 1005   HDL 34 (L) 11/20/2015 1005   CHOLHDL 4.9 11/20/2015 1005   LDLCALC 77 11/20/2015 1005      Wt Readings from Last 3 Encounters:  04/03/17 222 lb 8 oz (100.9 kg)  09/11/16 234 lb 8 oz (106.4 kg)  05/12/16 251 lb 8 oz (114.1 kg)         ASSESSMENT AND PLAN:  1.  chronic systolic heart failure:  He appears to be euvolemic on small dose of torsemide. He is on optimal medical therapy.  He continues to be mildly hypotensive with dizziness. Thus, I elected to decrease eplerenone to 25 mg once daily.    2. Coronary artery disease involving native coronary arteries with mild stable angina:  No significant change in symptoms. Continue medical therapy.   3. Hyperlipidemia: Continue treatment with atorvastatin and fish oil. Most recent LDL was 77. I requested lipid and liver profile.    Disposition:   FU with me in 6 months  Signed,  [name], MD  04/03/2017 2:15 PM    [facility]

## 2017-04-04 LAB — BASIC METABOLIC PANEL
BUN/Creatinine Ratio: 19 (ref 9–20)
BUN: 21 mg/dL (ref 6–24)
CO2: 27 mmol/L (ref 18–29)
CREATININE: 1.12 mg/dL (ref 0.76–1.27)
Calcium: 9.9 mg/dL (ref 8.7–10.2)
Chloride: 98 mmol/L (ref 96–106)
GFR calc Af Amer: 91 mL/min/{1.73_m2} (ref >59)
GFR, EST NON AFRICAN AMERICAN: 79 mL/min/{1.73_m2} (ref >59)
GLUCOSE: 95 mg/dL (ref 65–99)
Potassium: 4.1 mmol/L (ref 3.5–5.2)
Sodium: 143 mmol/L (ref 134–144)

## 2017-04-04 LAB — LIPID PANEL
CHOL/HDL RATIO: 4.9 ratio (ref 0.0–5.0)
CHOLESTEROL TOTAL: 191 mg/dL (ref 100–199)
HDL: 39 mg/dL — AB (ref >39)
LDL CALC: 100 mg/dL — AB (ref 0–99)
TRIGLYCERIDES: 260 mg/dL — AB (ref 0–149)
VLDL CHOLESTEROL CAL: 52 mg/dL — AB (ref 5–40)

## 2017-04-07 ENCOUNTER — Other Ambulatory Visit: Payer: Self-pay

## 2017-04-07 DIAGNOSIS — I2510 Atherosclerotic heart disease of native coronary artery without angina pectoris: Secondary | ICD-10-CM

## 2017-04-07 MED ORDER — ROSUVASTATIN CALCIUM 40 MG PO TABS: 40.0000 mg | ORAL_TABLET | Freq: Every day | ORAL | 5 refills | Status: DC

## 2017-04-13 LAB — HEPATIC FUNCTION PANEL

## 2017-05-07 ENCOUNTER — Ambulatory Visit (INDEPENDENT_AMBULATORY_CARE_PROVIDER_SITE_OTHER): Payer: BC Managed Care – PPO | Admitting: Internal Medicine

## 2017-05-07 ENCOUNTER — Encounter: Payer: Self-pay | Admitting: Internal Medicine

## 2017-05-07 VITALS — BP 100/60 | HR 57 | Ht 72.0 in | Wt 220.8 lb

## 2017-05-07 DIAGNOSIS — Z95810 Presence of automatic (implantable) cardiac defibrillator: Secondary | ICD-10-CM | POA: Diagnosis not present

## 2017-05-07 DIAGNOSIS — I5022 Chronic systolic (congestive) heart failure: Secondary | ICD-10-CM

## 2017-05-07 DIAGNOSIS — I255 Ischemic cardiomyopathy: Secondary | ICD-10-CM | POA: Diagnosis not present

## 2017-05-07 NOTE — Progress Notes (Signed)
Patient Care Team: Patient, No Pcp Per as PCP - General (General Practice)   HPI  Kirke Breach is a 46 y.o. male Seen to establish device follow-up in [place]. He has a history of cardiac arrest in the context of cardiogenic shock and acute LAD and RCA occlusion. Efforts at percutaneous intervention will complicated by dissection and he underwent bypass surgery followed 2 days later by the VF arrest. Repeat catheterization demonstrated no clear triggering event and he underwent ICD implantation.  He has been followed by Dr. Johney Frame in [place]   he continues to lose weight and feels better  Still with orthostasis   His diet is variable regarding salt intake--and edema ; he has not correlated his salt intake edema and orthostasis either positively or negatively.   Records and Results Reviewed old office records. Myoview scan 4/15 ejection fraction 29%. Echocardiogram 4/17 EF 25-30% with mild LAE ECG 4/17 narrow QRS  Past Medical History:  Diagnosis Date  . CAD (coronary artery disease) 2012   STEMI 07/08/11  . Chronic systolic dysfunction of left ventricle   . H/O cardiac arrest    VF arrest 2 days following CABG  . Ischemic cardiomyopathy    St. Jude ICD    Past Surgical History:  Procedure Laterality Date  . CARDIAC CATHETERIZATION     [place]  . CARDIAC DEFIBRILLATOR PLACEMENT  08/08/11   SJM Fortify ST VR implanted by Dr Selena Batten at [facility]  . CORONARY ARTERY BYPASS GRAFT  07/28/11   LIMA to OM, SVG to LAD, SVGF to PDA    Current Outpatient Prescriptions  Medication Sig Dispense Refill  . aspirin 81 MG tablet Take 81 mg by mouth daily.    . carvedilol (COREG) 6.25 MG tablet Take 1 tablet (6.25 mg total) by mouth 2 (two) times daily. 180 tablet 3  . cyclobenzaprine (FLEXERIL) 10 MG tablet Take 1 tablet (10 mg total) by mouth 3 (three) times daily as needed for muscle spasms. 90 tablet 2  . eplerenone (INSPRA) 25 MG tablet Take 1 tablet (25 mg  total) by mouth daily. 30 tablet 5  . magnesium oxide (MAG-OX) 400 MG tablet Take 1 tablet (400 mg total) by mouth 2 (two) times daily. 6 tablet 0  . Multiple Vitamin (MULTIVITAMIN) capsule Take 1 capsule by mouth daily.    . nitroGLYCERIN (NITROSTAT) 0.4 MG SL tablet Place 1 tablet (0.4 mg total) under the tongue every 5 (five) minutes as needed for chest pain. 90 tablet 3  . Omega-3 Fatty Acids ([employer] FISH OIL) 1000 MG CAPS Take 3,000 mg by mouth daily. 90 capsule   . rosuvastatin (CRESTOR) 40 MG tablet Take 1 tablet (40 mg total) by mouth daily. 30 tablet 5  . sacubitril-valsartan (ENTRESTO) 24-26 MG TAKE 1 TABLET BY MOUTH 2 (TWO) TIMES DAILY. 180 tablet 1  . torsemide (DEMADEX) 20 MG tablet Take 1 tablet (20 mg total) by mouth every morning. 90 tablet 3   No current facility-administered medications for this visit.     Allergies  Allergen Reactions  . Penicillins Hives    As a child      Review of Systems negative except from HPI and PMH  Physical Exam BP 100/60 (BP Location: Left Arm, Patient Position: Sitting, Cuff Size: Normal)   Pulse (!) 57   Ht 6' (1.829 m)   Wt 220 lb 12 oz (100.1 kg)   BMI 29.94 kg/m  Well developed and nourished in no acute distress HENT  normal Neck supple with JVP-flat Clear Regular rate and rhythm, no murmurs or gallops Abd-soft with active BS No Clubbing cyanosis edema Skin-warm and dry A & Oriented  Grossly normal sensory and motor function  ECG  Sinus @ 57 19/12/42 Assessment and  Plan  Ischemic cardiomyopathy  Morbid obesity  Congestive heart failure-chronic-systolic  Orthostatic lightheadedness  Implantable defibrillator St. Jude The patient's device was interrogated.  The information was reviewed. No changes were made in the programming.      Without symptoms of ischemia  Losing weight with diet and exercise  Orthostasis is intermittently problematic and I wonder if this is related to using his diuretics regularly while is  salt intake varies widely esp when he is traveling on the road   Hence prior to making decision re medication adjustments have asked him to try and correlate his orthostasis with he is traveling scheduled to see if we can implicate increased sodium intake fewer symptoms and the more symptoms with more diuretic in the setting of lower salt intake.  Euvolemic continue current meds  Without symptoms of ischemia

## 2017-05-07 NOTE — Patient Instructions (Signed)

## 2017-05-22 ENCOUNTER — Other Ambulatory Visit: Payer: BC Managed Care – PPO

## 2017-05-26 ENCOUNTER — Other Ambulatory Visit: Payer: Self-pay | Admitting: *Deleted

## 2017-05-26 ENCOUNTER — Other Ambulatory Visit (INDEPENDENT_AMBULATORY_CARE_PROVIDER_SITE_OTHER): Payer: BC Managed Care – PPO

## 2017-05-26 DIAGNOSIS — E782 Mixed hyperlipidemia: Secondary | ICD-10-CM | POA: Diagnosis not present

## 2017-05-26 DIAGNOSIS — Z79899 Other long term (current) drug therapy: Secondary | ICD-10-CM

## 2017-05-27 LAB — LIPID PANEL
CHOL/HDL RATIO: 4.1 ratio (ref 0.0–5.0)
Cholesterol, Total: 151 mg/dL (ref 100–199)
HDL: 37 mg/dL — AB (ref >39)
LDL Calculated: 87 mg/dL (ref 0–99)
Triglycerides: 134 mg/dL (ref 0–149)
VLDL CHOLESTEROL CAL: 27 mg/dL (ref 5–40)

## 2017-05-27 LAB — HEPATIC FUNCTION PANEL
ALK PHOS: 84 IU/L (ref 39–117)
ALT: 24 IU/L (ref 0–44)
AST: 20 IU/L (ref 0–40)
Albumin: 4.7 g/dL (ref 3.5–5.5)
Bilirubin Total: 0.4 mg/dL (ref 0.0–1.2)
Bilirubin, Direct: 0.12 mg/dL (ref 0.00–0.40)
TOTAL PROTEIN: 6.8 g/dL (ref 6.0–8.5)

## 2017-09-16 ENCOUNTER — Other Ambulatory Visit: Payer: Self-pay | Admitting: Cardiovascular Disease

## 2017-09-16 DIAGNOSIS — I5022 Chronic systolic (congestive) heart failure: Secondary | ICD-10-CM

## 2017-09-17 ENCOUNTER — Encounter: Payer: Self-pay | Admitting: Cardiovascular Disease

## 2017-09-17 ENCOUNTER — Ambulatory Visit (INDEPENDENT_AMBULATORY_CARE_PROVIDER_SITE_OTHER): Payer: BC Managed Care – PPO | Admitting: Cardiovascular Disease

## 2017-09-17 VITALS — BP 98/60 | HR 62 | Ht 72.0 in | Wt 221.0 lb

## 2017-09-17 DIAGNOSIS — I2510 Atherosclerotic heart disease of native coronary artery without angina pectoris: Secondary | ICD-10-CM | POA: Diagnosis not present

## 2017-09-17 DIAGNOSIS — E782 Mixed hyperlipidemia: Secondary | ICD-10-CM

## 2017-09-17 DIAGNOSIS — Z23 Encounter for immunization: Secondary | ICD-10-CM | POA: Diagnosis not present

## 2017-09-17 DIAGNOSIS — E7800 Pure hypercholesterolemia, unspecified: Secondary | ICD-10-CM

## 2017-09-17 DIAGNOSIS — I5022 Chronic systolic (congestive) heart failure: Secondary | ICD-10-CM | POA: Diagnosis not present

## 2017-09-17 MED ORDER — EZETIMIBE 10 MG PO TABS: 10.0000 mg | ORAL_TABLET | Freq: Every day | ORAL | 3 refills | Status: DC

## 2017-09-17 NOTE — Patient Instructions (Addendum)
Medication Instructions:  Your physician has recommended you make the following change in your medication:  START taking zetia 10mg  once daily   Labwork: BMET, lipid and liver profile [facility]. No appointment needed. Nothing to eat or drink after midnight the evening before your labs  Testing/Procedures: none  Follow-Up: Your physician wants you to follow-up in: 6 months with Dr. Kirke CorinArida.  You will receive a reminder letter in the mail two months in advance. If you don't receive a letter, please call our office to schedule the follow-up appointment.   Any Other Special Instructions Will Be Listed Below (If Applicable).     If you need a refill on your cardiac medications before your next appointment, please call your pharmacy.  Influenza Virus Vaccine (Flucelvax) What is this medicine? INFLUENZA VIRUS VACCINE (in floo EN zuh VAHY ruhs vak SEEN) helps to reduce the risk of getting influenza also known as the flu. The vaccine only helps protect you against some strains of the flu. This medicine may be used for other purposes; ask your health care provider or pharmacist if you have questions. COMMON BRAND NAME(S): FLUCELVAX What should I tell my health care provider before I take this medicine? They need to know if you have any of these conditions: -bleeding disorder like hemophilia -fever or infection -Guillain-Barre syndrome or other neurological problems -immune system problems -infection with the human immunodeficiency virus (HIV) or AIDS -low blood platelet counts -multiple sclerosis -an unusual or allergic reaction to influenza virus vaccine, other medicines, foods, dyes or preservatives -pregnant or trying to get pregnant -breast-feeding How should I use this medicine? This vaccine is for injection into a muscle. It is given by a health care professional. A copy of Vaccine Information Statements will be given before each vaccination. Read this  sheet carefully each time. The sheet may change frequently. Talk to your pediatrician regarding the use of this medicine in children. Special care may be needed. Overdosage: If you think you've taken too much of this medicine contact a poison control center or emergency room at once. Overdosage: If you think you have taken too much of this medicine contact a poison control center or emergency room at once. NOTE: This medicine is only for you. Do not share this medicine with others. What if I miss a dose? This does not apply. What may interact with this medicine? -chemotherapy or radiation therapy -medicines that lower your immune system like etanercept, anakinra, infliximab, and adalimumab -medicines that treat or prevent blood clots like warfarin -phenytoin -steroid medicines like prednisone or cortisone -theophylline -vaccines This list may not describe all possible interactions. Give your health care provider a list of all the medicines, herbs, non-prescription drugs, or dietary supplements you use. Also tell them if you smoke, drink alcohol, or use illegal drugs. Some items may interact with your medicine. What should I watch for while using this medicine? Report any side effects that do not go away within 3 days to your doctor or health care professional. Call your health care provider if any unusual symptoms occur within 6 weeks of receiving this vaccine. You may still catch the flu, but the illness is not usually as bad. You cannot get the flu from the vaccine. The vaccine will not protect against colds or other illnesses that may cause fever. The vaccine is needed every year. What side effects may I notice from receiving this medicine? Side effects that you should report to your doctor or  health care professional as soon as possible: -allergic reactions like skin rash, itching or hives, swelling of the face, lips, or tongue Side effects that usually do not require medical attention  (Report these to your doctor or health care professional if they continue or are bothersome.): -fever -headache -muscle aches and pains -pain, tenderness, redness, or swelling at the injection site -tiredness This list may not describe all possible side effects. Call your doctor for medical advice about side effects. You may report side effects to FDA at 1-800-FDA-1088. Where should I keep my medicine? The vaccine will be given by a health care professional in a clinic, pharmacy, doctor's office, or other health care setting. You will not be given vaccine doses to store at home. NOTE: This sheet is a summary. It may not cover all possible information. If you have questions about this medicine, talk to your doctor, pharmacist, or health care provider.  2018 Elsevier/Gold Standard (2011-10-29 14:06:47)

## 2017-09-17 NOTE — Progress Notes (Signed)
Cardiology Office Note   Date:  09/17/2017   ID:  Wood Novacek, DOB [birth year], MRN [id number]  PCP:  Patient, No Pcp Per  Cardiologist:   [name], MD   Chief Complaint  Patient presents with  . other    6 month follow up. Patient c/o chest pain Occ. with swelling in legs. Meds reviewed verbally with patient.       History of Present Illness: Mateo Overbeck is a 46 y.o. male who presents for  a followup visit regarding coronary artery disease and chronic systolic heart failure.  He has known h/o CAD s/p anterior STEMI with cardiogenic shock in 2012 .  cardiac catheterization showed 100% LAD occlusion, 100% RCA occlusion, and 80% LCx stenosis.  Intervention was performed but per report, this was aborted due to dissection.  He underwent CABG on 07/28/11.  2 days later, he had a VF arrest.  Emergent cath revealed patent CABG grafts but CAD distal to graft touchdown.  He underwent implantation of a SJM Fortify ST VR ICD 08/08/11.  He has done reasonably well since that time.    He had a myoview 4/15 which revealed EF 29%.   Most recent echocardiogram in April 2017 showed an ejection fraction of 25-30% with akinesis of the anteroseptal, anterior, anterolateral and apical myocardium.  He has been doing extremely well with no chest pain, shortness of breath or palpitations. His weight is stable. He has not been able to lose any more weight since last time. He is taking his medications regularly with no reported side effects. During last visit, I decreased the pleura known to 25 mg once daily due to dizziness and low blood pressure. He reports improvement in symptoms.  Past Medical History:  Diagnosis Date  . CAD (coronary artery disease) 2012   STEMI 07/08/11  . Chronic systolic dysfunction of left ventricle   . H/O cardiac arrest    VF arrest 2 days following CABG  . Ischemic cardiomyopathy    St. Jude ICD    Past Surgical History:  Procedure Laterality Date  . CARDIAC  CATHETERIZATION     [place]  . CARDIAC DEFIBRILLATOR PLACEMENT  08/08/11   SJM Fortify ST VR implanted by Dr Selena Batten at [facility]  . CORONARY ARTERY BYPASS GRAFT  07/28/11   LIMA to OM, SVG to LAD, SVGF to PDA     Current Outpatient Prescriptions  Medication Sig Dispense Refill  . aspirin 81 MG tablet Take 81 mg by mouth daily.    . carvedilol (COREG) 6.25 MG tablet Take 1 tablet (6.25 mg total) by mouth 2 (two) times daily. 180 tablet 3  . cyclobenzaprine (FLEXERIL) 10 MG tablet Take 1 tablet (10 mg total) by mouth 3 (three) times daily as needed for muscle spasms. 90 tablet 2  . eplerenone (INSPRA) 25 MG tablet TAKE 1 TABLET BY MOUTH EVERY DAY 30 tablet 5  . magnesium oxide (MAG-OX) 400 MG tablet Take 1 tablet (400 mg total) by mouth 2 (two) times daily. 6 tablet 0  . Multiple Vitamin (MULTIVITAMIN) capsule Take 1 capsule by mouth daily.    . nitroGLYCERIN (NITROSTAT) 0.4 MG SL tablet Place 1 tablet (0.4 mg total) under the tongue every 5 (five) minutes as needed for chest pain. 90 tablet 3  . Omega-3 Fatty Acids ([employer] FISH OIL) 1000 MG CAPS Take 3,000 mg by mouth daily. 90 capsule   . sacubitril-valsartan (ENTRESTO) 24-26 MG TAKE 1 TABLET BY MOUTH 2 (TWO) TIMES DAILY. 180  tablet 1  . torsemide (DEMADEX) 20 MG tablet Take 1 tablet (20 mg total) by mouth every morning. 90 tablet 3  . ezetimibe (ZETIA) 10 MG tablet Take 1 tablet (10 mg total) by mouth daily. 90 tablet 3  . rosuvastatin (CRESTOR) 40 MG tablet Take 1 tablet (40 mg total) by mouth daily. 30 tablet 5   No current facility-administered medications for this visit.     Allergies:   Penicillins    Social History:  The patient  reports that he quit smoking about 6 years ago. His smoking use included Cigarettes. He has a 15.00 pack-year smoking history. He has never used smokeless tobacco. He reports that he drinks alcohol. He reports that he does not use drugs.   Family History:  The patient's family history  includes Cancer in his father.    ROS:  Please see the history of present illness.   Otherwise, review of systems are positive for none.   All other systems are reviewed and negative.    PHYSICAL EXAM: VS:  BP 98/60 (BP Location: Left Arm, Patient Position: Sitting, Cuff Size: Large)   Pulse 62   Ht 6' (1.829 m)   Wt 221 lb (100.2 kg)   BMI 29.97 kg/m  , BMI Body mass index is 29.97 kg/m. GEN: Well nourished, well developed, in no acute distress  HEENT: normal  Neck: no JVD, carotid bruits, or masses Cardiac: RRR; no murmurs, rubs, or gallops.  trace edema bilaterally.  Respiratory:  clear to auscultation bilaterally, normal work of breathing GI: soft, nontender, nondistended, + BS MS: no deformity or atrophy  Skin: warm and dry, no rash Neuro:  Strength and sensation are intact Psych: euthymic mood, full affect   EKG:  EKG is ordered today. EKG showed normal sinus rhythm with poor R-wave progression in the anterior leads   Recent Labs: 04/03/2017: BUN 21; Creatinine, Ser 1.12; Potassium 4.1; Sodium 143 05/26/2017: ALT 24    Lipid Panel    Component Value Date/Time   CHOL 151 05/26/2017 0947   TRIG 134 05/26/2017 0947   HDL 37 (L) 05/26/2017 0947   CHOLHDL 4.1 05/26/2017 0947   LDLCALC 87 05/26/2017 0947      Wt Readings from Last 3 Encounters:  09/17/17 221 lb (100.2 kg)  05/07/17 220 lb 12 oz (100.1 kg)  04/03/17 222 lb 8 oz (100.9 kg)         ASSESSMENT AND PLAN:  1.  Chronic systolic heart failure:  Currently New York Heart Association class II.He appears to be euvolemic on small dose of torsemide. He is on optimal medical therapy.   I requested basic metabolic profile to be done next month.   2. Coronary artery disease involving native coronary arteries with mild stable angina:  No significant change in symptoms. Continue medical therapy.   3. Hyperlipidemia: I reviewed most recent lipid profile done in June which showed an LDL of 87. This is in spite  of maximal dose rosuvastatin 40 mg daily. I elected to add Zetia 10 mg once daily. Repeat lipid and liver profile in 6 weeks. I discussed with him the importance of healthy diet, exercise and weight loss. He wants to get his weight down to around 205 pounds.   Disposition:   FU with me in 6 months  Signed,  Lorine BearsMuhammad Lainy Wrobleski, MD  09/17/2017 12:38 PM    [facility]

## 2017-09-19 ENCOUNTER — Other Ambulatory Visit: Payer: Self-pay | Admitting: Cardiovascular Disease

## 2017-09-30 ENCOUNTER — Other Ambulatory Visit: Payer: Self-pay | Admitting: Cardiovascular Disease

## 2017-10-11 ENCOUNTER — Encounter: Payer: Self-pay | Admitting: Cardiovascular Disease

## 2017-10-12 ENCOUNTER — Other Ambulatory Visit: Payer: Self-pay

## 2017-10-12 MED ORDER — NITROGLYCERIN 0.4 MG SL SUBL: 0.4000 mg | SUBLINGUAL_TABLET | SUBLINGUAL | 3 refills | Status: DC | PRN

## 2017-10-26 ENCOUNTER — Other Ambulatory Visit (INDEPENDENT_AMBULATORY_CARE_PROVIDER_SITE_OTHER): Payer: BC Managed Care – PPO

## 2017-10-26 DIAGNOSIS — E7800 Pure hypercholesterolemia, unspecified: Secondary | ICD-10-CM

## 2017-10-26 DIAGNOSIS — I5022 Chronic systolic (congestive) heart failure: Secondary | ICD-10-CM

## 2017-10-27 LAB — HEPATIC FUNCTION PANEL
ALBUMIN: 4.4 g/dL (ref 3.5–5.5)
ALT: 54 IU/L — AB (ref 0–44)
AST: 38 IU/L (ref 0–40)
Alkaline Phosphatase: 73 IU/L (ref 39–117)
BILIRUBIN TOTAL: 0.5 mg/dL (ref 0.0–1.2)
BILIRUBIN, DIRECT: 0.14 mg/dL (ref 0.00–0.40)
Total Protein: 6.3 g/dL (ref 6.0–8.5)

## 2017-10-27 LAB — LIPID PANEL
CHOLESTEROL TOTAL: 112 mg/dL (ref 100–199)
Chol/HDL Ratio: 2.8 ratio (ref 0.0–5.0)
HDL: 40 mg/dL (ref >39)
LDL Calculated: 36 mg/dL (ref 0–99)
Triglycerides: 178 mg/dL — ABNORMAL HIGH (ref 0–149)
VLDL Cholesterol Cal: 36 mg/dL (ref 5–40)

## 2017-10-27 LAB — BASIC METABOLIC PANEL
BUN / CREAT RATIO: 10 (ref 9–20)
BUN: 11 mg/dL (ref 6–24)
CHLORIDE: 101 mmol/L (ref 96–106)
CO2: 25 mmol/L (ref 20–29)
Calcium: 9.2 mg/dL (ref 8.7–10.2)
Creatinine, Ser: 1.07 mg/dL (ref 0.76–1.27)
GFR calc Af Amer: 96 mL/min/{1.73_m2} (ref >59)
GFR calc non Af Amer: 83 mL/min/{1.73_m2} (ref >59)
GLUCOSE: 99 mg/dL (ref 65–99)
POTASSIUM: 4.1 mmol/L (ref 3.5–5.2)
Sodium: 143 mmol/L (ref 134–144)

## 2017-10-30 ENCOUNTER — Telehealth: Payer: Self-pay | Admitting: Cardiology

## 2017-10-30 NOTE — Telephone Encounter (Signed)
Spoke w/ pt and requested that he send a manual transmission b/c his home monitor has not updated in at least 7 days.   

## 2017-11-06 ENCOUNTER — Telehealth: Payer: Self-pay | Admitting: Cardiology

## 2017-11-06 NOTE — Telephone Encounter (Signed)
Spoke w/ pt and requested that he send a manual transmission b/c his home monitor has not updated in at least 7 days.   

## 2017-11-13 ENCOUNTER — Telehealth: Payer: Self-pay | Admitting: Cardiology

## 2017-11-13 ENCOUNTER — Ambulatory Visit (INDEPENDENT_AMBULATORY_CARE_PROVIDER_SITE_OTHER): Payer: BC Managed Care – PPO | Admitting: *Deleted

## 2017-11-13 DIAGNOSIS — I255 Ischemic cardiomyopathy: Secondary | ICD-10-CM | POA: Diagnosis not present

## 2017-11-13 NOTE — Telephone Encounter (Signed)
Spoke w/ pt about his disconnected monitor he stated that he called tech support and he is waiting for a new monitor / cell adapter. Once he receives the new items he will send the transmission at that time.

## 2017-11-18 ENCOUNTER — Encounter: Payer: Self-pay | Admitting: Cardiology

## 2017-11-18 NOTE — Progress Notes (Signed)
Remote ICD transmission.   

## 2017-11-25 LAB — CUP PACEART REMOTE DEVICE CHECK
Battery Remaining Longevity: 58 mo
Battery Remaining Percentage: 50 %
Battery Voltage: 2.93 V
Brady Statistic RV Percent Paced: 1 %
HIGH POWER IMPEDANCE MEASURED VALUE: 47 Ohm
Implantable Lead Implant Date: 20120907
Implantable Pulse Generator Implant Date: 20120907
Lead Channel Impedance Value: 440 Ohm
Lead Channel Pacing Threshold Amplitude: 1 V
Lead Channel Pacing Threshold Pulse Width: 0.5 ms
Lead Channel Setting Pacing Amplitude: 2.5 V
MDC IDC LEAD LOCATION: 753860
MDC IDC MSMT LEADCHNL RV SENSING INTR AMPL: 12 mV
MDC IDC SESS DTM: 20181214155247
MDC IDC SET LEADCHNL RV PACING PULSEWIDTH: 0.5 ms
MDC IDC SET LEADCHNL RV SENSING SENSITIVITY: 0.5 mV
Pulse Gen Serial Number: 1027081

## 2017-12-06 ENCOUNTER — Encounter: Payer: Self-pay | Admitting: Cardiovascular Disease

## 2017-12-07 ENCOUNTER — Telehealth: Payer: Self-pay | Admitting: Cardiovascular Disease

## 2017-12-07 NOTE — Telephone Encounter (Signed)
Pt w/history of CAD, CHF, STEMI s/p CABG in 2012, ICD sent MyChart message concerned for chest pain over the past week. I s/w pt who reports continual 7 out of 10 chest pain Friday and Saturday night with difficulty sleeping. Saturday night he slept sitting up which helped symptoms. He took nitro without much improvement. Chest pain is a 2 or 3 out of 10 during the day. He was able to go for a walk Saturday with minimal pain.  He has a hx of this type of chest pain but typically has had symptoms 1-2 times per month lasting 15-20 minutes. He is concerned as he has had several continuous days of pain.  Denies arm or jaw pain, diaphoresis, nausea, or SOB.   BP has been within normal range for patient.  No chest pain at this time. Saturday afternoon was last episode.  Pt compliant with medications.  He will continue to monitor and await advice from Dr. Kirke CorinArida. If needed, he can come in for an appt.   " Hello Dr Kirke CorinArida,    I am checking in for advice as I have been having a new symptom this week. For the last four days, I have had increasing trouble laying flat while sleeping. I am waking up after about two hours of laying down with noticeable chest pain. It's happened a few times over the last month, but I have been able to just wake up and it subsides, but for the last four days it's gotten worse. Last night I had so much chest pain the nitoglycerin didn't relive it much and I had to prop myself up to sleep. Once I was propped up I was able to get some restful sleep for a few hours, and didn't wake with chest pain.    When I first got out of the hospital after my big episode, I had this issue, but it has been years since I had an issue with chest pain while sleeping flat.  As it has now been more than a few days, I thought I should reach out and get some advice on what I should do. I don't think it is an emergency but I have learned not to ignore new symptoms.   Roy E.

## 2017-12-07 NOTE — Telephone Encounter (Signed)
Reviewed with Dr. Kirke CorinArida and scheduled pt January 10, 3:20pm. He understands if sx return, worsen, or are continuous, he should proceed to the ED for urgent evaluation. He is agreeable to appt and plan.

## 2017-12-10 ENCOUNTER — Other Ambulatory Visit
Admission: RE | Admit: 2017-12-10 | Discharge: 2017-12-10 | Disposition: A | Discharge status: Home / Self Care | Payer: BC Managed Care – PPO | Source: Ambulatory Visit | Attending: Cardiovascular Disease | Admitting: Cardiovascular Disease

## 2017-12-10 ENCOUNTER — Encounter: Payer: Self-pay | Admitting: Cardiovascular Disease

## 2017-12-10 ENCOUNTER — Ambulatory Visit
Admission: RE | Admit: 2017-12-10 | Discharge: 2017-12-10 | Disposition: A | Discharge status: Home / Self Care | Payer: BC Managed Care – PPO | Source: Ambulatory Visit | Attending: Cardiovascular Disease | Admitting: Cardiovascular Disease

## 2017-12-10 ENCOUNTER — Ambulatory Visit: Payer: BC Managed Care – PPO | Admitting: Cardiovascular Disease

## 2017-12-10 VITALS — BP 98/60 | HR 74 | Ht 72.0 in | Wt 221.0 lb

## 2017-12-10 DIAGNOSIS — Z01812 Encounter for preprocedural laboratory examination: Secondary | ICD-10-CM | POA: Diagnosis not present

## 2017-12-10 DIAGNOSIS — E782 Mixed hyperlipidemia: Secondary | ICD-10-CM

## 2017-12-10 DIAGNOSIS — I5022 Chronic systolic (congestive) heart failure: Secondary | ICD-10-CM

## 2017-12-10 DIAGNOSIS — I200 Unstable angina: Secondary | ICD-10-CM | POA: Diagnosis not present

## 2017-12-10 DIAGNOSIS — Z01818 Encounter for other preprocedural examination: Secondary | ICD-10-CM | POA: Insufficient documentation

## 2017-12-10 LAB — CBC
HCT: 48 % (ref 40.0–52.0)
Hemoglobin: 16.3 g/dL (ref 13.0–18.0)
MCH: 31.2 pg (ref 26.0–34.0)
MCHC: 33.9 g/dL (ref 32.0–36.0)
MCV: 91.9 fL (ref 80.0–100.0)
PLATELETS: 188 10*3/uL (ref 150–440)
RBC: 5.22 MIL/uL (ref 4.40–5.90)
RDW: 12.7 % (ref 11.5–14.5)
WBC: 7.6 10*3/uL (ref 3.8–10.6)

## 2017-12-10 LAB — BASIC METABOLIC PANEL
Anion gap: 11 (ref 5–15)
BUN: 22 mg/dL — ABNORMAL HIGH (ref 6–20)
CALCIUM: 9.3 mg/dL (ref 8.9–10.3)
CO2: 30 mmol/L (ref 22–32)
CREATININE: 1.24 mg/dL (ref 0.61–1.24)
Chloride: 97 mmol/L — ABNORMAL LOW (ref 101–111)
GFR calc non Af Amer: >60 mL/min (ref >60)
Glucose, Bld: 101 mg/dL — ABNORMAL HIGH (ref 65–99)
Potassium: 3.3 mmol/L — ABNORMAL LOW (ref 3.5–5.1)
SODIUM: 138 mmol/L (ref 135–145)

## 2017-12-10 LAB — PROTIME-INR
INR: 0.9
PROTHROMBIN TIME: 12.1 s (ref 11.4–15.2)

## 2017-12-10 MED ORDER — PANTOPRAZOLE SODIUM 40 MG PO TBEC: 40.0000 mg | DELAYED_RELEASE_TABLET | Freq: Every day | ORAL | 11 refills | Status: DC

## 2017-12-10 NOTE — Patient Instructions (Addendum)
Medication Instructions:  Your physician has recommended you make the following change in your medication:  START taking protonix 40mg  once daily    Labwork: [facility]   Testing/Procedures: A chest x-ray takes a picture of the organs and structures inside the chest, including the heart, lungs, and blood vessels. This test can show several things, including, whether the heart is enlarges; whether fluid is building up in the lungs; and whether pacemaker / defibrillator leads are still in place. [facility]  Your physician has requested that you have a cardiac catheterization. Cardiac catheterization is used to diagnose and/or treat various heart conditions. Doctors may recommend this procedure for a number of different reasons. The most common reason is to evaluate chest pain. Chest pain can be a symptom of coronary artery disease (CAD), and cardiac catheterization can show whether plaque is narrowing or blocking your heart's arteries. This procedure is also used to evaluate the valves, as well as measure the blood flow and oxygen levels in different parts of your heart. For further information please visit [email or web address]. Please follow instruction sheet, as given.  [facility] Cardiac Cath Instructions   You are scheduled for a Cardiac Cath on: Monday, December 14, 2017  Please arrive at 8:30am on the day of your procedure  Please [facility] a [facility] from [facility] to pre-register you  Do not eat/drink anything after midnight  Someone will need to drive you home  It is recommended someone be with you for the first 24 hours after your procedure  Wear clothes that are easy to get on/off and wear slip on shoes if possible   Medications bring a current list of all medications with you   __xx_ Do not take entresto or lasix the morning of your procedure   Day of your procedure: Arrive at the Medical Mall  entrance.  Free valet service is available.  After entering the Medical Mall please check-in at the registration desk (1st desk on your right) to receive your armband. After receiving your armband someone will escort you to the cardiac cath/special procedures waiting area.  The usual length of stay after your procedure is about 2 to 3 hours.  This can vary.  If you have any questions, please call our office at [phone], or you may call the cardiac cath lab at [facility] directly at [phone]   Follow-Up: Your physician recommends that you schedule a follow-up appointment in: 1 month with Dr. Kirke CorinArida.    Any Other Special Instructions Will Be Listed Below (If Applicable).     If you need a refill on your cardiac medications before your next appointment, please call your pharmacy.   Angiogram An angiogram, also called angiography, is a procedure used to look at the blood vessels. In this procedure, dye is injected through a long, thin tube (catheter) into an artery. X-rays are then taken. The X-rays will show if there is a blockage or problem in a blood vessel. Tell a health care provider about:  Any allergies you have, including allergies to shellfish or contrast dye.  All medicines you are taking, including vitamins, herbs, eye drops, creams, and over-the-counter medicines.  Any problems you or family members have had with anesthetic medicines.  Any blood disorders you have.  Any surgeries you have had.  Any previous kidney problems or failure you have had.  Any medical conditions you have.  Possibility of pregnancy, if this applies.  What are the risks? Generally, an angiogram is a safe procedure. However, as with any procedure, problems can occur. Possible problems include:  Injury to the blood vessels, including rupture or bleeding.  Infection or bruising at the catheter site.  Allergic reaction to the dye or contrast used.  Kidney damage from the dye or contrast  used.  Blood clots that can lead to a stroke or heart attack.  What happens before the procedure?  Do not eat or drink after midnight on the night before the procedure, or as directed by your health care provider.  Ask your health care provider if you may drink enough water to take any needed medicines the morning of the procedure. What happens during the procedure?  You may be given a medicine to help you relax (sedative) before and during the procedure. This medicine is given through an IV access tube that is inserted into one of your veins.  The area where the catheter will be inserted will be washed and shaved. This is usually done in the groin but may be done in the fold of your arm (near your elbow) or in the wrist.  A medicine will be given to numb the area where the catheter will be inserted (local anesthetic).  The catheter will be inserted with a guide wire into an artery. The catheter is guided by using a type of X-ray (fluoroscopy) to the blood vessel being examined.  Dye is then injected into the catheter, and X-rays are taken. The dye helps to show where any narrowing or blockages are located. What happens after the procedure?  If the procedure is done through the leg, you will be kept in bed lying flat for several hours. You will be instructed to not bend or cross your legs.  The insertion site will be checked frequently.  The pulse in your feet or wrist will be checked frequently.  Additional blood tests, X-rays, and electrocardiography may be done.  You may need to stay in the hospital overnight for observation. This information is not intended to replace advice given to you by your health care provider. Make sure you discuss any questions you have with your health care provider. Document Released: 08/27/2005 Document Revised: 04/30/2016 Document Reviewed: 04/20/2013 Elsevier Interactive Patient Education  2017 Elsevier Inc.  Angiogram, Care After This sheet  gives you information about how to care for yourself after your procedure. Your health care provider may also give you more specific instructions. If you have problems or questions, contact your health care provider. What can I expect after the procedure? After the procedure, it is common to have bruising and tenderness at the catheter insertion area. Follow these instructions at home: Insertion site care  Follow instructions from your health care provider about how to take care of your insertion site. Make sure you: ? Wash your hands with soap and water before you change your bandage (dressing). If soap and water are not available, use hand sanitizer. ? Change your dressing as told by your health care provider. ? Leave stitches (sutures), skin glue, or adhesive strips in place. These skin closures may need to stay in place for 2 weeks or longer. If adhesive strip edges start to loosen and curl up, you may trim the loose edges. Do not remove adhesive strips completely unless your health care provider tells you to do that.  Do not take baths, swim, or use a hot tub until your health care provider approves.  You may shower 24-48  hours after the procedure or as told by your health care provider. ? Gently wash the site with plain soap and water. ? Pat the area dry with a clean towel. ? Do not rub the site. This may cause bleeding.  Do not apply powder or lotion to the site. Keep the site clean and dry.  Check your insertion site every day for signs of infection. Check for: ? Redness, swelling, or pain. ? Fluid or blood. ? Warmth. ? Pus or a bad smell. Activity  Rest as told by your health care provider, usually for 1-2 days.  Do not lift anything that is heavier than 10 lbs. (4.5 kg) or as told by your health care provider.  Do not drive for 24 hours if you were given a medicine to help you relax (sedative).  Do not drive or use heavy machinery while taking prescription pain  medicine. General instructions  Return to your normal activities as told by your health care provider, usually in about a week. Ask your health care provider what activities are safe for you.  If the catheter site starts bleeding, lie flat and put pressure on the site. If the bleeding does not stop, get help right away. This is a medical emergency.  Drink enough fluid to keep your urine clear or pale yellow. This helps flush the contrast dye from your body.  Take over-the-counter and prescription medicines only as told by your health care provider.  Keep all follow-up visits as told by your health care provider. This is important. Contact a health care provider if:  You have a fever or chills.  You have redness, swelling, or pain around your insertion site.  You have fluid or blood coming from your insertion site.  The insertion site feels warm to the touch.  You have pus or a bad smell coming from your insertion site.  You have bruising around the insertion site.  You notice blood collecting in the tissue around the catheter site (hematoma). The hematoma may be painful to the touch. Get help right away if:  You have severe pain at the catheter insertion area.  The catheter insertion area swells very fast.  The catheter insertion area is bleeding, and the bleeding does not stop when you hold steady pressure on the area.  The area near or just beyond the catheter insertion site becomes pale, cool, tingly, or numb. These symptoms may represent a serious problem that is an emergency. Do not wait to see if the symptoms will go away. Get medical help right away. Call your local emergency services (911 in the [place] Do not drive yourself to the hospital. Summary  After the procedure, it is common to have bruising and tenderness at the catheter insertion area.  After the procedure, it is important to rest and drink plenty of fluids.  Do not take baths, swim, or use a hot tub until  your health care provider says it is okay to do so. You may shower 24-48 hours after the procedure or as told by your health care provider.  If the catheter site starts bleeding, lie flat and put pressure on the site. If the bleeding does not stop, get help right away. This is a medical emergency. This information is not intended to replace advice given to you by your health care provider. Make sure you discuss any questions you have with your health care provider. Document Released: 06/05/2005 Document Revised: 10/22/2016 Document Reviewed: 10/22/2016 Elsevier Interactive Patient Education  2018 Elsevier Inc.  

## 2017-12-10 NOTE — Progress Notes (Signed)
  Cardiology Office Note   Date:  12/10/2017   ID:  Roy Turner, DOB 11/15/1971, MRN [id number]  PCP:  Patient, No Pcp Per  Cardiologist:   Kyrian Stage, MD   Chief Complaint  Patient presents with  . Other    Patient c/o chest pain throughout the night for about four days when lying flat and a constant pressure on his chest.  Meds reviewed verbally with patient.       History of Present Illness: Roy Turner is a 47 y.o. male who presents for  a followup visit regarding coronary artery disease and chronic systolic heart failure.  He has known h/o CAD s/p anterior STEMI with cardiogenic shock in 2012 .  cardiac catheterization showed 100% LAD occlusion, 100% RCA occlusion, and 80% LCx stenosis.  Intervention was performed but per report, this was aborted due to dissection.  He underwent CABG on 07/28/11.  2 days later, he had a VF arrest.  Emergent cath revealed patent CABG grafts but CAD distal to graft touchdown.  He underwent implantation of a SJM Fortify ST VR ICD 08/08/11.  He has done reasonably well since that time.    He had a myoview 4/15 which revealed EF 29%.   Most recent echocardiogram in April 2017 showed an ejection fraction of 25-30% with akinesis of the anteroseptal, anterior, anterolateral and apical myocardium.  He was added to my schedule today due to recent symptoms of chest pain.  Over the last week, he has experienced recurrent episodes of substernal chest pain described as tightness feeling mostly happening when he lies down, at night and occasionally early morning hours.  The symptoms wake him up from sleep.  The pain partially responsive to nitroglycerin.  Since he started having the symptoms, he has not done much exercise of physical activities.  He reports that the symptoms are very similar to what he had after his myocardial infarction.  He denies heartburn although he does report that he had to take Nexium in the past but not recently. He also reports orthostatic  dizziness although he is not really orthostatic today by exam.  Past Medical History:  Diagnosis Date  . CAD (coronary artery disease) 2012   STEMI 07/08/11  . Chronic systolic dysfunction of left ventricle   . H/O cardiac arrest    VF arrest 2 days following CABG  . Ischemic cardiomyopathy    St. Jude ICD    Past Surgical History:  Procedure Laterality Date  . CARDIAC CATHETERIZATION     [place]  . CARDIAC DEFIBRILLATOR PLACEMENT  08/08/11   SJM Fortify ST VR implanted by Dr Kim at [facility]  . CORONARY ARTERY BYPASS GRAFT  07/28/11   LIMA to OM, SVG to LAD, SVGF to PDA     Current Outpatient Medications  Medication Sig Dispense Refill  . aspirin 81 MG tablet Take 81 mg by mouth daily.    . carvedilol (COREG) 6.25 MG tablet Take 1 tablet (6.25 mg total) by mouth 2 (two) times daily. 180 tablet 3  . cyclobenzaprine (FLEXERIL) 10 MG tablet Take 1 tablet (10 mg total) by mouth 3 (three) times daily as needed for muscle spasms. 90 tablet 2  . ENTRESTO 24-26 MG TAKE 1 TABLET BY MOUTH 2 (TWO) TIMES DAILY. 180 tablet 1  . eplerenone (INSPRA) 25 MG tablet TAKE 1 TABLET BY MOUTH EVERY DAY 30 tablet 5  . ezetimibe (ZETIA) 10 MG tablet Take 1 tablet (10 mg total) by mouth daily. 90   tablet 3  . magnesium oxide (MAG-OX) 400 MG tablet Take 1 tablet (400 mg total) by mouth 2 (two) times daily. 6 tablet 0  . Multiple Vitamin (MULTIVITAMIN) capsule Take 1 capsule by mouth daily.    . nitroGLYCERIN (NITROSTAT) 0.4 MG SL tablet Place 1 tablet (0.4 mg total) every 5 (five) minutes as needed under the tongue for chest pain. 25 tablet 3  . Omega-3 Fatty Acids ([employer] FISH OIL) 1000 MG CAPS Take 3,000 mg by mouth daily. 90 capsule   . rosuvastatin (CRESTOR) 40 MG tablet TAKE 1 TABLET BY MOUTH EVERY DAY 30 tablet 3  . torsemide (DEMADEX) 20 MG tablet Take 1 tablet (20 mg total) by mouth every morning. 90 tablet 3   No current facility-administered medications for this visit.      Allergies:   Penicillins    Social History:  The patient  reports that he quit smoking about 6 years ago. His smoking use included cigarettes. He has a 15.00 pack-year smoking history. he has never used smokeless tobacco. He reports that he drinks alcohol. He reports that he does not use drugs.   Family History:  The patient's family history includes Cancer in his father.    ROS:  Please see the history of present illness.   Otherwise, review of systems are positive for none.   All other systems are reviewed and negative.    PHYSICAL EXAM: VS:  BP 98/60 (BP Location: Left Arm, Patient Position: Sitting, Cuff Size: Normal)   Pulse 74   Ht 6' (1.829 m)   Wt 221 lb (100.2 kg)   BMI 29.97 kg/m  , BMI Body mass index is 29.97 kg/m. GEN: Well nourished, well developed, in no acute distress  HEENT: normal  Neck: no JVD, carotid bruits, or masses Cardiac: RRR; no murmurs, rubs, or gallops.  trace edema bilaterally.  Respiratory:  clear to auscultation bilaterally, normal work of breathing GI: soft, nontender, nondistended, + BS MS: no deformity or atrophy  Skin: warm and dry, no rash Neuro:  Turner and sensation are intact Psych: euthymic mood, full affect   EKG:  EKG is ordered today. EKG showed normal sinus rhythm with poor R wave progression.  Nonspecific T wave changes.  No significant ischemic changes.   Recent Labs: 10/26/2017: ALT 54; BUN 11; Creatinine, Ser 1.07; Potassium 4.1; Sodium 143    Lipid Panel    Component Value Date/Time   CHOL 112 10/26/2017 0852   TRIG 178 (H) 10/26/2017 0852   HDL 40 10/26/2017 0852   CHOLHDL 2.8 10/26/2017 0852   LDLCALC 36 10/26/2017 0852      Wt Readings from Last 3 Encounters:  12/10/17 221 lb (100.2 kg)  09/17/17 221 lb (100.2 kg)  05/07/17 220 lb 12 oz (100.1 kg)         ASSESSMENT AND PLAN:  1. Coronary artery disease involving native coronary arteries with unstable angina:   Patient has extensive cardiac  history and now reports new symptoms of chest pain at rest worrisome for unstable angina given that he reports that the symptoms are very similar to what he had around his myocardial infarction.  Due to significant ischemic cardiomyopathy and scarring, but I do not think stress testing is going to be very helpful in his situation.  I think the best option is to proceed with urgent cardiac catheterization, graft angiography and possible PCI.  I discussed the procedure in details as well as risks and benefits. His left radial pulse is   weak and we might have to do the procedure via the right femoral artery. Some of his symptoms might be related to esophageal spasm and I elected to add Protonix 40 mg once daily.  2.  Chronic systolic heart failure:  Currently New York Heart Association class II.He appears to be euvolemic on small dose of torsemide. He is on optimal medical therapy.   He is on optimal medical therapy.    3. Hyperlipidemia: Lipid profile improved significantly with the addition of Zetia.  Continue this along with high-dose rosuvastatin.   Disposition:   FU with me in 1 month.  Signed,  Reese Stockman, MD  12/10/2017 3:24 PM     [facility] 

## 2017-12-10 NOTE — H&P (View-Only) (Signed)
Cardiology Office Note   Date:  12/10/2017   ID:  Roy Turner, DOB 03-21-1971, MRN [id number]  PCP:  Patient, No Pcp Per  Cardiologist:   [name], MD   Chief Complaint  Patient presents with  . Other    Patient c/o chest pain throughout the night for about four days when lying flat and a constant pressure on his chest.  Meds reviewed verbally with patient.       History of Present Illness: Roy Turner is a 47 y.o. male who presents for  a followup visit regarding coronary artery disease and chronic systolic heart failure.  He has known h/o CAD s/p anterior STEMI with cardiogenic shock in 2012 .  cardiac catheterization showed 100% LAD occlusion, 100% RCA occlusion, and 80% LCx stenosis.  Intervention was performed but per report, this was aborted due to dissection.  He underwent CABG on 07/28/11.  2 days later, he had a VF arrest.  Emergent cath revealed patent CABG grafts but CAD distal to graft touchdown.  He underwent implantation of a SJM Fortify ST VR ICD 08/08/11.  He has done reasonably well since that time.    He had a myoview 4/15 which revealed EF 29%.   Most recent echocardiogram in April 2017 showed an ejection fraction of 25-30% with akinesis of the anteroseptal, anterior, anterolateral and apical myocardium.  He was added to my schedule today due to recent symptoms of chest pain.  Over the last week, he has experienced recurrent episodes of substernal chest pain described as tightness feeling mostly happening when he lies down, at night and occasionally early morning hours.  The symptoms wake him up from sleep.  The pain partially responsive to nitroglycerin.  Since he started having the symptoms, he has not done much exercise of physical activities.  He reports that the symptoms are very similar to what he had after his myocardial infarction.  He denies heartburn although he does report that he had to take Nexium in the past but not recently. He also reports orthostatic  dizziness although he is not really orthostatic today by exam.  Past Medical History:  Diagnosis Date  . CAD (coronary artery disease) 2012   STEMI 07/08/11  . Chronic systolic dysfunction of left ventricle   . H/O cardiac arrest    VF arrest 2 days following CABG  . Ischemic cardiomyopathy    St. Jude ICD    Past Surgical History:  Procedure Laterality Date  . CARDIAC CATHETERIZATION     [place]  . CARDIAC DEFIBRILLATOR PLACEMENT  08/08/11   SJM Fortify ST VR implanted by Dr Selena Batten at [facility]  . CORONARY ARTERY BYPASS GRAFT  07/28/11   LIMA to OM, SVG to LAD, SVGF to PDA     Current Outpatient Medications  Medication Sig Dispense Refill  . aspirin 81 MG tablet Take 81 mg by mouth daily.    . carvedilol (COREG) 6.25 MG tablet Take 1 tablet (6.25 mg total) by mouth 2 (two) times daily. 180 tablet 3  . cyclobenzaprine (FLEXERIL) 10 MG tablet Take 1 tablet (10 mg total) by mouth 3 (three) times daily as needed for muscle spasms. 90 tablet 2  . ENTRESTO 24-26 MG TAKE 1 TABLET BY MOUTH 2 (TWO) TIMES DAILY. 180 tablet 1  . eplerenone (INSPRA) 25 MG tablet TAKE 1 TABLET BY MOUTH EVERY DAY 30 tablet 5  . ezetimibe (ZETIA) 10 MG tablet Take 1 tablet (10 mg total) by mouth daily. 90  tablet 3  . magnesium oxide (MAG-OX) 400 MG tablet Take 1 tablet (400 mg total) by mouth 2 (two) times daily. 6 tablet 0  . Multiple Vitamin (MULTIVITAMIN) capsule Take 1 capsule by mouth daily.    . nitroGLYCERIN (NITROSTAT) 0.4 MG SL tablet Place 1 tablet (0.4 mg total) every 5 (five) minutes as needed under the tongue for chest pain. 25 tablet 3  . Omega-3 Fatty Acids ([employer] FISH OIL) 1000 MG CAPS Take 3,000 mg by mouth daily. 90 capsule   . rosuvastatin (CRESTOR) 40 MG tablet TAKE 1 TABLET BY MOUTH EVERY DAY 30 tablet 3  . torsemide (DEMADEX) 20 MG tablet Take 1 tablet (20 mg total) by mouth every morning. 90 tablet 3   No current facility-administered medications for this visit.      Allergies:   Penicillins    Social History:  The patient  reports that he quit smoking about 6 years ago. His smoking use included cigarettes. He has a 15.00 pack-year smoking history. he has never used smokeless tobacco. He reports that he drinks alcohol. He reports that he does not use drugs.   Family History:  The patient's family history includes Cancer in his father.    ROS:  Please see the history of present illness.   Otherwise, review of systems are positive for none.   All other systems are reviewed and negative.    PHYSICAL EXAM: VS:  BP 98/60 (BP Location: Left Arm, Patient Position: Sitting, Cuff Size: Normal)   Pulse 74   Ht 6' (1.829 m)   Wt 221 lb (100.2 kg)   BMI 29.97 kg/m  , BMI Body mass index is 29.97 kg/m. GEN: Well nourished, well developed, in no acute distress  HEENT: normal  Neck: no JVD, carotid bruits, or masses Cardiac: RRR; no murmurs, rubs, or gallops.  trace edema bilaterally.  Respiratory:  clear to auscultation bilaterally, normal work of breathing GI: soft, nontender, nondistended, + BS MS: no deformity or atrophy  Skin: warm and dry, no rash Neuro:  Strength and sensation are intact Psych: euthymic mood, full affect   EKG:  EKG is ordered today. EKG showed normal sinus rhythm with poor R wave progression.  Nonspecific T wave changes.  No significant ischemic changes.   Recent Labs: 10/26/2017: ALT 54; BUN 11; Creatinine, Ser 1.07; Potassium 4.1; Sodium 143    Lipid Panel    Component Value Date/Time   CHOL 112 10/26/2017 0852   TRIG 178 (H) 10/26/2017 0852   HDL 40 10/26/2017 0852   CHOLHDL 2.8 10/26/2017 0852   LDLCALC 36 10/26/2017 0852      Wt Readings from Last 3 Encounters:  12/10/17 221 lb (100.2 kg)  09/17/17 221 lb (100.2 kg)  05/07/17 220 lb 12 oz (100.1 kg)         ASSESSMENT AND PLAN:  1. Coronary artery disease involving native coronary arteries with unstable angina:   Patient has extensive cardiac  history and now reports new symptoms of chest pain at rest worrisome for unstable angina given that he reports that the symptoms are very similar to what he had around his myocardial infarction.  Due to significant ischemic cardiomyopathy and scarring, but I do not think stress testing is going to be very helpful in his situation.  I think the best option is to proceed with urgent cardiac catheterization, graft angiography and possible PCI.  I discussed the procedure in details as well as risks and benefits. His left radial pulse is  weak and we might have to do the procedure via the right femoral artery. Some of his symptoms might be related to esophageal spasm and I elected to add Protonix 40 mg once daily.  2.  Chronic systolic heart failure:  Currently New York Heart Association class II.He appears to be euvolemic on small dose of torsemide. He is on optimal medical therapy.   He is on optimal medical therapy.    3. Hyperlipidemia: Lipid profile improved significantly with the addition of Zetia.  Continue this along with high-dose rosuvastatin.   Disposition:   FU with me in 1 month.  Signed,  Lorine BearsMuhammad Shaughnessy Gethers, MD  12/10/2017 3:24 PM    [facility]

## 2017-12-14 ENCOUNTER — Encounter
Admission: RE | Disposition: A | Discharge status: Home / Self Care | Payer: Self-pay | Source: Ambulatory Visit | Attending: Cardiovascular Disease

## 2017-12-14 ENCOUNTER — Ambulatory Visit
Admission: RE | Admit: 2017-12-14 | Discharge: 2017-12-14 | Disposition: A | Discharge status: Home / Self Care | Payer: BC Managed Care – PPO | Source: Ambulatory Visit | Attending: Cardiovascular Disease | Admitting: Cardiovascular Disease

## 2017-12-14 ENCOUNTER — Encounter: Payer: Self-pay | Admitting: *Deleted

## 2017-12-14 DIAGNOSIS — Z8674 Personal history of sudden cardiac arrest: Secondary | ICD-10-CM | POA: Diagnosis not present

## 2017-12-14 DIAGNOSIS — Z95810 Presence of automatic (implantable) cardiac defibrillator: Secondary | ICD-10-CM | POA: Diagnosis not present

## 2017-12-14 DIAGNOSIS — I5022 Chronic systolic (congestive) heart failure: Secondary | ICD-10-CM | POA: Insufficient documentation

## 2017-12-14 DIAGNOSIS — E785 Hyperlipidemia, unspecified: Secondary | ICD-10-CM | POA: Insufficient documentation

## 2017-12-14 DIAGNOSIS — I25110 Atherosclerotic heart disease of native coronary artery with unstable angina pectoris: Secondary | ICD-10-CM | POA: Insufficient documentation

## 2017-12-14 DIAGNOSIS — Z87891 Personal history of nicotine dependence: Secondary | ICD-10-CM | POA: Insufficient documentation

## 2017-12-14 DIAGNOSIS — R079 Chest pain, unspecified: Secondary | ICD-10-CM

## 2017-12-14 DIAGNOSIS — I200 Unstable angina: Secondary | ICD-10-CM | POA: Diagnosis not present

## 2017-12-14 DIAGNOSIS — Z7982 Long term (current) use of aspirin: Secondary | ICD-10-CM | POA: Insufficient documentation

## 2017-12-14 DIAGNOSIS — Z79899 Other long term (current) drug therapy: Secondary | ICD-10-CM | POA: Insufficient documentation

## 2017-12-14 DIAGNOSIS — Z9889 Other specified postprocedural states: Secondary | ICD-10-CM | POA: Diagnosis not present

## 2017-12-14 DIAGNOSIS — I255 Ischemic cardiomyopathy: Secondary | ICD-10-CM | POA: Insufficient documentation

## 2017-12-14 DIAGNOSIS — Z880 Allergy status to penicillin: Secondary | ICD-10-CM | POA: Diagnosis not present

## 2017-12-14 DIAGNOSIS — I25700 Atherosclerosis of coronary artery bypass graft(s), unspecified, with unstable angina pectoris: Secondary | ICD-10-CM | POA: Insufficient documentation

## 2017-12-14 DIAGNOSIS — I252 Old myocardial infarction: Secondary | ICD-10-CM | POA: Insufficient documentation

## 2017-12-14 HISTORY — PX: LEFT HEART CATH AND CORS/GRAFTS ANGIOGRAPHY: CATH118250

## 2017-12-14 SURGERY — LEFT HEART CATH AND CORS/GRAFTS ANGIOGRAPHY
Anesthesia: Moderate Sedation | Laterality: Left

## 2017-12-14 MED ORDER — HEPARIN (PORCINE) IN NACL 2-0.9 UNIT/ML-% IJ SOLN
INTRAMUSCULAR | Status: AC
  Filled 2017-12-14: qty 500

## 2017-12-14 MED ORDER — SODIUM CHLORIDE 0.9 % IV SOLN: 250.0000 mL | INTRAVENOUS | Status: DC | PRN

## 2017-12-14 MED ORDER — ASPIRIN 81 MG PO CHEW: 81.0000 mg | CHEWABLE_TABLET | ORAL | Status: DC

## 2017-12-14 MED ORDER — SODIUM CHLORIDE 0.9 % IV SOLN
INTRAVENOUS | Status: AC | PRN
  Administered 2017-12-14: 250 mL via INTRAVENOUS

## 2017-12-14 MED ORDER — FENTANYL CITRATE (PF) 100 MCG/2ML IJ SOLN
INTRAMUSCULAR | Status: AC
  Filled 2017-12-14: qty 2

## 2017-12-14 MED ORDER — FENTANYL CITRATE (PF) 100 MCG/2ML IJ SOLN
INTRAMUSCULAR | Status: DC | PRN
  Administered 2017-12-14 (×2): 25 ug via INTRAVENOUS

## 2017-12-14 MED ORDER — SODIUM CHLORIDE 0.9 % IV SOLN
INTRAVENOUS | Status: DC
  Administered 2017-12-14: 09:00:00 via INTRAVENOUS

## 2017-12-14 MED ORDER — SODIUM CHLORIDE 0.9% FLUSH: 3.0000 mL | Freq: Two times a day (BID) | INTRAVENOUS | Status: DC

## 2017-12-14 MED ORDER — SODIUM CHLORIDE 0.9% FLUSH: 3.0000 mL | INTRAVENOUS | Status: DC | PRN

## 2017-12-14 MED ORDER — MIDAZOLAM HCL 2 MG/2ML IJ SOLN
INTRAMUSCULAR | Status: AC
  Filled 2017-12-14: qty 2

## 2017-12-14 MED ORDER — MIDAZOLAM HCL 2 MG/2ML IJ SOLN
INTRAMUSCULAR | Status: DC | PRN
  Administered 2017-12-14 (×2): 1 mg via INTRAVENOUS

## 2017-12-14 MED ORDER — SODIUM CHLORIDE 0.9 % IV SOLN: INTRAVENOUS | Status: DC

## 2017-12-14 MED ORDER — IOPAMIDOL (ISOVUE-300) INJECTION 61%
INTRAVENOUS | Status: DC | PRN
  Administered 2017-12-14: 85 mL via INTRA_ARTERIAL

## 2017-12-14 SURGICAL SUPPLY — 11 items
CATH INFINITI 5 FR IM (CATHETERS) ×3 IMPLANT
CATH INFINITI 5FR ANG PIGTAIL (CATHETERS) ×3 IMPLANT
CATH INFINITI 5FR JL4 (CATHETERS) ×3 IMPLANT
CATH INFINITI JR4 5F (CATHETERS) ×3 IMPLANT
DEVICE CLOSURE MYNXGRIP 5F (Vascular Products) ×3 IMPLANT
KIT MANI 3VAL PERCEP (MISCELLANEOUS) ×3 IMPLANT
NEEDLE PERC 18GX7CM (NEEDLE) ×3 IMPLANT
PACK CARDIAC CATH (CUSTOM PROCEDURE TRAY) ×3 IMPLANT
SHEATH AVANTI 5FR X 11CM (SHEATH) ×3 IMPLANT
WIRE EMERALD 3MM-J .035X150CM (WIRE) ×3 IMPLANT
WIRE EMERALD 3MM-J .035X260CM (WIRE) ×3 IMPLANT

## 2017-12-14 NOTE — Interval H&P Note (Signed)
Cath Lab Visit (complete for each Cath Lab visit)  Clinical Evaluation Leading to the Procedure:   ACS: Yes.   (unstable angina)  Non-ACS:  n/a    History and Physical Interval Note:  12/14/2017 9:43 AM  Roy Turner  has presented today for surgery, with the diagnosis of LT Cath w grafts Possible PCI     Chest pain  The various methods of treatment have been discussed with the patient and family. After consideration of risks, benefits and other options for treatment, the patient has consented to  Procedure(s): LEFT HEART CATH AND CORS/GRAFTS ANGIOGRAPHY (Left) as a surgical intervention .  The patient's history has been reviewed, patient examined, no change in status, stable for surgery.  I have reviewed the patient's chart and labs.  Questions were answered to the patient's satisfaction.     Lorine BearsMuhammad Amulya Quintin

## 2017-12-29 ENCOUNTER — Telehealth: Payer: Self-pay | Admitting: Cardiovascular Disease

## 2017-12-29 NOTE — Telephone Encounter (Signed)
Key # [id number]  Cover my meds calling to speak with sharon about entresto application error please call

## 2017-12-30 NOTE — Telephone Encounter (Signed)
Went to [email or web address] and sent chat message to follow up on this application. Below is message from [employer] with specifics regarding this request. Will route to Mardene CelesteSharon Yow RN for her review. Let me know if you have any further questions.   Rosezena Sensoratrick S.: It looks like the current PA expires on 01/05/18 and was last filled at 180 for 90 days and does require a PA. So to complete this PA the best recommendation would be to contact the plan directly to complete this or I can send you the hard copy to complete that way if you have access to [phone]

## 2018-01-05 NOTE — Telephone Encounter (Signed)
Resubmitted PA request. Message stating too soon to refill. Will resubmit tomorrow, February 6

## 2018-01-06 NOTE — Telephone Encounter (Signed)
Chat with [employer]: [facility]ed [facility]o 24/26mg , 90 day supply filled December 24, 2017 for $10. No indication for PA at this time.

## 2018-01-08 ENCOUNTER — Ambulatory Visit: Payer: BC Managed Care – PPO | Admitting: Cardiovascular Disease

## 2018-01-08 ENCOUNTER — Telehealth: Payer: Self-pay | Admitting: Cardiovascular Disease

## 2018-01-08 NOTE — Telephone Encounter (Signed)
[facility] [phone] Quentin AngstGrandfathered Entresto form completed and faxed to [facility], [phone]

## 2018-01-13 ENCOUNTER — Encounter: Payer: Self-pay | Admitting: Nurse Practitioner

## 2018-01-13 ENCOUNTER — Ambulatory Visit: Payer: BC Managed Care – PPO | Admitting: Nurse Practitioner

## 2018-01-13 VITALS — BP 100/60 | HR 74 | Ht 72.0 in | Wt 220.5 lb

## 2018-01-13 DIAGNOSIS — I5022 Chronic systolic (congestive) heart failure: Secondary | ICD-10-CM

## 2018-01-13 DIAGNOSIS — I255 Ischemic cardiomyopathy: Secondary | ICD-10-CM | POA: Diagnosis not present

## 2018-01-13 DIAGNOSIS — I25708 Atherosclerosis of coronary artery bypass graft(s), unspecified, with other forms of angina pectoris: Secondary | ICD-10-CM

## 2018-01-13 DIAGNOSIS — E876 Hypokalemia: Secondary | ICD-10-CM

## 2018-01-13 MED ORDER — TORSEMIDE 10 MG PO TABS: ORAL_TABLET | ORAL | 6 refills | Status: DC

## 2018-01-13 NOTE — Progress Notes (Signed)
Office Visit    Patient Name: Roy Turner Date of Encounter: 01/13/2018  Primary Care Provider:  Patient, No Pcp Per Primary Cardiologist:  Roy Bears, MD  Chief Complaint    47 year old male with Turner prior history of CAD status post anterior MI, HFrEF, ischemic cardiomyopathy with an EF of 25-30%, and hyperlipidemia, who presents for follow-up after recent diagnostic catheterization.  Past Medical History    Past Medical History:  Diagnosis Date  . CAD (coronary artery disease)    Turner. 07/2011 Anterior STEMI w/ CGS/Cath: LAD 100, LCX 80, RCA 100-->PCI aborted 2/2 dissection-->CABG x 3 (VG->LAD, Free LIMA->OM2, VG->RCA); b. 07/30/2011 Emergent Cath 2/2 VF arrest: patent grafts w/ CAD distal to graft touchdown; c. 03/2014 Neg MV, EF 29%; d. 12/2017 Cath: LM nl, LAD 90ost/p, 100p/m, D1 90, LCX 60, RCA 80p, 70m, 100d ISR. VG->LAD ok, FLIMA->OM2 ok, VG->RCA 100 w/ L->R collats->Med Rx  . H/O cardiac arrest    Turner. 07/2011 VF arrest 2 days following CABG.  . HFrEF (heart failure with reduced ejection fraction) (HCC)    Turner. 03/2016 Echo: EF 25-30%, antsept, ant, antlat, apical AK. Mildly dil LA.  Marland Kitchen Hyperlipidemia   . Ischemic cardiomyopathy    Turner. 08/2011 s/p SJM [id number] Fortify ST VR single lead AICD; b. 03/2016 Echo: EF 25-30%.   Past Surgical History:  Procedure Laterality Date  . CARDIAC CATHETERIZATION     [place]  . CARDIAC DEFIBRILLATOR PLACEMENT  08/08/11   SJM Fortify ST VR implanted by Dr Roy Turner at [facility]  . CORONARY ARTERY BYPASS GRAFT  07/28/11   LIMA to OM, SVG to LAD, SVGF to PDA  . LEFT HEART CATH AND CORS/GRAFTS ANGIOGRAPHY Left 12/14/2017   Procedure: LEFT HEART CATH AND CORS/GRAFTS ANGIOGRAPHY;  Surgeon: Roy Ouch, MD;  Location: [facility] INVASIVE CV LAB;  Service: Cardiovascular;  Laterality: Left;    Allergies  Allergies  Allergen Reactions  . Penicillins Hives    As Turner child    History of Present Illness    47 year old male with the above  complex past medical history including coronary artery disease status post anterior ST segment elevation myocardial infarction in August 2012 complicated by cardiogenic shock and subsequent VF arrest.  Catheterization revealed Turner total occlusion of the LAD and also the right coronary artery with severe left circumflex stenosis.  Intervention was performed but per report, this was aborted due to dissection and he subsequently underwent CABG x3 on July 28, 2011.  2 days later, he developed VF arrest in the setting with the patient says was hypokalemia, and relook catheterization showed patent grafts but coronary disease distal to graft insertions.  He later underwent ICD placement and has been managed for ischemic cardia myopathy and HFrEF ever since.  Last echo in April 2017 showed an EF of 25-30%.  Mr. Roy Turner was recently seen in clinic on January 10 secondary to episodic substernal chest discomfort and tightness mostly occurring while lying down at night or occasionally in the early morning hours.  Symptoms were waking him up from sleep and were reminiscent of prior anginal pain.  He was placed on Protonix and scheduled for diagnostic catheterization which took place on January 14 revealing severe, native multivessel CAD with Turner patent vein graft to the LAD, patent free LIMA to the second obtuse marginal, and an occluded vein graft to the right coronary artery with chronic total occlusion of the native right coronary artery.  Medical therapy was recommended.  Since his catheterization,  Mr. Roy Turner says he has been doing much better and has not been having any nocturnal symptoms since initiating Protonix.  He has chronic, stable exertional angina generally at higher levels of activity.  He does exercise regularly.  He has not had any change to his stable pattern of exertional angina.  From Turner volume standpoint, he is generally well controlled most days out of the month but he does have to travel for work about 3-4  days Turner month and when doing so, he has more salt in his diet and is more likely to have some swelling.  In those settings, he takes an additional half of Turner torsemide.  He would like to have Turner prescription for as needed torsemide.  He denies palpitations, PND, orthopnea, dizziness, syncope, or early satiety.  Home Medications    Prior to Admission medications   Medication Sig Start Date End Date Taking? Authorizing Provider  aspirin 81 MG tablet Take 81 mg by mouth daily.   Yes [provider]  carvedilol (COREG) 6.25 MG tablet Take 1 tablet (6.25 mg total) by mouth 2 (two) times daily. 02/27/17  Yes Roy OuchArida, Roy A, MD  cyclobenzaprine (FLEXERIL) 10 MG tablet Take 1 tablet (10 mg total) by mouth 3 (three) times daily as needed for muscle spasms. 08/16/14  Yes Turner, Roy AusMuhammad A, MD  ENTRESTO 24-26 MG TAKE 1 TABLET BY MOUTH 2 (TWO) TIMES DAILY. 09/30/17  Yes Roy OuchArida, Roy A, MD  eplerenone (INSPRA) 25 MG tablet TAKE 1 TABLET BY MOUTH EVERY DAY 09/16/17  Yes Roy OuchArida, Roy A, MD  ezetimibe (ZETIA) 10 MG tablet Take 1 tablet (10 mg total) by mouth daily. 09/17/17 01/13/18 Yes Roy OuchArida, Roy A, MD  magnesium oxide (MAG-OX) 400 MG tablet Take 1 tablet (400 mg total) by mouth 2 (two) times daily. 12/25/14  Yes Roy OuchArida, Roy A, MD  Multiple Vitamin (MULTIVITAMIN) capsule Take 1 capsule by mouth daily.   Yes [provider]  nitroGLYCERIN (NITROSTAT) 0.4 MG SL tablet Place 1 tablet (0.4 mg total) every 5 (five) minutes as needed under the tongue for chest pain. 10/12/17  Yes Roy OuchArida, Roy A, MD  Omega-3 Fatty Acids ([employer] FISH OIL) 1000 MG CAPS Take 3,000 mg by mouth daily. 09/04/14  Yes Roy OuchArida, Roy A, MD  pantoprazole (PROTONIX) 40 MG tablet Take 1 tablet (40 mg total) by mouth daily. 12/10/17  Yes Roy OuchArida, Roy A, MD  rosuvastatin (CRESTOR) 40 MG tablet TAKE 1 TABLET BY MOUTH EVERY DAY 09/21/17  Yes Roy OuchArida, Roy A, MD  torsemide (DEMADEX) 20 MG tablet Take 1 tablet (20 mg  total) by mouth every morning. 02/27/17  Yes Roy OuchArida, Roy A, MD    Review of Systems    Overall doing well.  He has stable exertional angina at higher levels of activity.  He occasionally notes lower extremity swelling, especially his right ankle when his salt intake is higher.  He denies palpitations, dyspnea, PND, orthopnea, dizziness, syncope, or early satiety..  All other systems reviewed and are otherwise negative except as noted above.  Physical Exam    VS:  BP 100/60 (BP Location: Left Arm, Patient Position: Sitting, Cuff Size: Normal)   Pulse 74   Ht 6' (1.829 m)   Wt 220 lb 8 oz (100 kg)   BMI 29.91 kg/m  , BMI Body mass index is 29.91 kg/m. GEN: Well nourished, well developed, in no acute distress.  HEENT: normal.  Neck: Supple, no JVD, carotid bruits, or masses. Cardiac: RRR, no murmurs, rubs, or  gallops. No clubbing, cyanosis, edema.  Radials/DP/PT 2+ and equal bilaterally.  Respiratory:  Respirations regular and unlabored, clear to auscultation bilaterally. GI: Soft, nontender, nondistended, BS + x 4. MS: no deformity or atrophy. Skin: warm and dry, no rash. Neuro:  Strength and sensation are intact. Psych: Normal affect.  Accessory Clinical Findings    ECG -regular sinus rhythm, 76, left atrial enlargement, anterior infarct, no acute ST or T changes.  Assessment & Plan    1.  Coronary artery disease: Status post prior anterior MI and CABG x3 in 2012.  He was recently evaluated secondary to nocturnal symptoms reminiscent of prior angina.  Catheterization was performed and showed native multivessel disease with patent free LIMA to the OM 2 and vein graft to the LAD.  Vein graft to the RCA was totally occluded as well as the native RCA.  Medical therapy was recommended.  Symptoms have improved significantly with the addition of PPI.  He has not been having any nocturnal symptoms since then.  He does have chronic, stable exertional angina.  This is not changed in years.   We did discuss the role of long-acting nitrates but in the setting of soft blood pressures and prior history of orthostasis, we collectively agreed to continue medical therapy as is.  This includes aspirin, beta-blocker, and statin therapy.  2.  Ischemic cardiomyopathy/HFrEF: Euvolemic on exam today.  Weight has been stable.  He remains on beta-blocker, eplerenone, Entresto, and daily torsemide.  Turner few days Turner month, he requires additional torsemide in the setting of lower extremity swelling related to increased salt in his diet when traveling.  I have provided him with prescription for torsemide 10 mg as needed for weight gain and edema.  I am checking Turner basic metabolic panel today as he was hypokalemic prior to his catheterization.  3.  Hyperlipidemia: LDL of 36 in November 2018 with mild elevation of ALT at that time.  With plan to follow-up repeat LFTs in Turner few months.  4.  Hypokalemia: Potassium was 3.3 on January 10.  He is on 20 mill grams of torsemide daily and occasionally takes an additional 10 mg.  Follow-up basic metabolic panel today with low threshold to add daily potassium if necessary.  5.  Disposition: Follow-up with Dr. Kirke Corin in 2-3 months or sooner if necessary.  [name], NP 01/13/2018, 2:04 PM

## 2018-01-13 NOTE — Patient Instructions (Addendum)
Medication Instructions: - A prescription has been sent to your pharmacy for Torsemide 10 mg - take 1 tablet daily as needed for increased weight gain/ swelling/ shortness of breath  Labwork: - Your physician recommends that you have lab work today: BMP  Procedures/Testing: - none ordered  Follow-Up: - Your physician recommends that you schedule a follow-up appointment in: 2-3 months   Any Additional Special Instructions Will Be Listed Below (If Applicable).     If you need a refill on your cardiac medications before your next appointment, please call your pharmacy.

## 2018-01-13 NOTE — Telephone Encounter (Signed)
Pt in office today; states he received notification yesterday that his Sherryll Burgerntresto is covered. No prior authorization needed.

## 2018-01-14 LAB — BASIC METABOLIC PANEL
BUN/Creatinine Ratio: 15 (ref 9–20)
BUN: 15 mg/dL (ref 6–24)
CALCIUM: 9.8 mg/dL (ref 8.7–10.2)
CHLORIDE: 100 mmol/L (ref 96–106)
CO2: 25 mmol/L (ref 20–29)
Creatinine, Ser: 1.01 mg/dL (ref 0.76–1.27)
GFR calc Af Amer: 103 mL/min/{1.73_m2} (ref >59)
GFR calc non Af Amer: 89 mL/min/{1.73_m2} (ref >59)
Glucose: 145 mg/dL — ABNORMAL HIGH (ref 65–99)
POTASSIUM: 3.9 mmol/L (ref 3.5–5.2)
Sodium: 141 mmol/L (ref 134–144)

## 2018-01-26 ENCOUNTER — Other Ambulatory Visit: Payer: Self-pay | Admitting: Cardiovascular Disease

## 2018-02-14 ENCOUNTER — Other Ambulatory Visit: Payer: Self-pay | Admitting: Cardiovascular Disease

## 2018-02-15 ENCOUNTER — Other Ambulatory Visit: Payer: Self-pay | Admitting: *Deleted

## 2018-02-15 MED ORDER — TORSEMIDE 20 MG PO TABS: 20.0000 mg | ORAL_TABLET | ORAL | 3 refills | Status: DC

## 2018-02-17 ENCOUNTER — Ambulatory Visit (INDEPENDENT_AMBULATORY_CARE_PROVIDER_SITE_OTHER): Payer: BC Managed Care – PPO | Admitting: *Deleted

## 2018-02-17 ENCOUNTER — Telehealth: Payer: Self-pay | Admitting: Cardiology

## 2018-02-17 DIAGNOSIS — I255 Ischemic cardiomyopathy: Secondary | ICD-10-CM

## 2018-02-17 NOTE — Telephone Encounter (Signed)
LMOVM reminding pt to send remote transmission.   

## 2018-02-18 ENCOUNTER — Encounter: Payer: Self-pay | Admitting: Cardiology

## 2018-02-19 ENCOUNTER — Encounter: Payer: Self-pay | Admitting: Cardiology

## 2018-02-19 NOTE — Progress Notes (Signed)
Remote ICD transmission.   

## 2018-02-27 ENCOUNTER — Other Ambulatory Visit: Payer: Self-pay | Admitting: Cardiovascular Disease

## 2018-03-12 LAB — CUP PACEART REMOTE DEVICE CHECK
HIGH POWER IMPEDANCE MEASURED VALUE: 47 Ohm
Implantable Lead Implant Date: 20120907
Implantable Pulse Generator Implant Date: 20120907
Lead Channel Impedance Value: 440 Ohm
Lead Channel Pacing Threshold Amplitude: 1 V
Lead Channel Pacing Threshold Pulse Width: 0.5 ms
Lead Channel Setting Pacing Pulse Width: 0.5 ms
Lead Channel Setting Sensing Sensitivity: 0.5 mV
MDC IDC LEAD LOCATION: 753860
MDC IDC MSMT BATTERY REMAINING LONGEVITY: 55 mo
MDC IDC MSMT BATTERY REMAINING PERCENTAGE: 47 %
MDC IDC MSMT BATTERY VOLTAGE: 2.93 V
MDC IDC MSMT LEADCHNL RV SENSING INTR AMPL: 12 mV
MDC IDC SESS DTM: 20190322101321
MDC IDC SET LEADCHNL RV PACING AMPLITUDE: 2.5 V
MDC IDC STAT BRADY RV PERCENT PACED: 1 %
Pulse Gen Serial Number: 1027081

## 2018-03-15 ENCOUNTER — Other Ambulatory Visit: Payer: Self-pay | Admitting: Cardiovascular Disease

## 2018-03-15 DIAGNOSIS — I5022 Chronic systolic (congestive) heart failure: Secondary | ICD-10-CM

## 2018-03-19 ENCOUNTER — Encounter: Payer: Self-pay | Admitting: Cardiovascular Disease

## 2018-03-19 ENCOUNTER — Ambulatory Visit: Payer: BC Managed Care – PPO | Admitting: Cardiovascular Disease

## 2018-03-19 VITALS — BP 106/70 | HR 73 | Ht 72.0 in | Wt 220.0 lb

## 2018-03-19 DIAGNOSIS — I25118 Atherosclerotic heart disease of native coronary artery with other forms of angina pectoris: Secondary | ICD-10-CM | POA: Diagnosis not present

## 2018-03-19 DIAGNOSIS — E782 Mixed hyperlipidemia: Secondary | ICD-10-CM | POA: Diagnosis not present

## 2018-03-19 DIAGNOSIS — I5022 Chronic systolic (congestive) heart failure: Secondary | ICD-10-CM

## 2018-03-19 NOTE — Progress Notes (Signed)
Cardiology Office Note   Date:  03/19/2018   ID:  Roy Turner, DOB Dec 24, 1970, MRN [id number]  PCP:  Patient, No Pcp Per  Cardiologist:   [name], MD   Chief Complaint  Patient presents with  . Other    2-3 month follow up. Patient c/o swelling in legs and ankles. Meds reviewed verbally with patient.       History of Present Illness: Roy Turner is a 47 y.o. male who presents for  a followup visit regarding coronary artery disease and chronic systolic heart failure.  He has known h/o CAD s/p anterior STEMI with cardiogenic shock in 2012 .  cardiac catheterization showed 100% LAD occlusion, 100% RCA occlusion, and 80% LCx stenosis.  Intervention was performed but per report, this was aborted due to dissection.  He underwent CABG on 07/28/11.  2 days later, he had a VF arrest.  Emergent cath revealed patent CABG grafts but CAD distal to graft touchdown.  He underwent implantation of a SJM Fortify ST VR ICD 08/08/11.  He has done reasonably well since that time.    He had a myoview 4/15 which revealed EF 29%.   Most recent echocardiogram in April 2017 showed an ejection fraction of 25-30% with akinesis of the anteroseptal, anterior, anterolateral and apical myocardium. He had worsening nocturnal angina earlier this year.  Cardiac catheterization was done in January which showed significant underlying three-vessel coronary artery disease with patent LIMA to OM 2 and SVG to LAD.  SVG to RCA was occluded with chronically occluded native right coronary artery with right to right and left to right collaterals.  LVEDP was normal.  He was treated medically and overall reports no recent anginal symptoms.  He has occasional leg edema that responds to torsemide.  Past Medical History:  Diagnosis Date  . CAD (coronary artery disease)    a. 07/2011 Anterior STEMI w/ CGS/Cath: LAD 100, LCX 80, RCA 100-->PCI aborted 2/2 dissection-->CABG x 3 (VG->LAD, Free LIMA->OM2, VG->RCA); b. 07/30/2011 Emergent  Cath 2/2 VF arrest: patent grafts w/ CAD distal to graft touchdown; c. 03/2014 Neg MV, EF 29%; d. 12/2017 Cath: LM nl, LAD 90ost/p, 100p/m, D1 90, LCX 60, RCA 80p, 79m, 100d ISR. VG->LAD ok, FLIMA->OM2 ok, VG->RCA 100 w/ L->R collats->Med Rx  . H/O cardiac arrest    a. 07/2011 VF arrest 2 days following CABG.  . HFrEF (heart failure with reduced ejection fraction) (HCC)    a. 03/2016 Echo: EF 25-30%, antsept, ant, antlat, apical AK. Mildly dil LA.  Marland Kitchen Hyperlipidemia   . Ischemic cardiomyopathy    a. 08/2011 s/p SJM [id number] Fortify ST VR single lead AICD; b. 03/2016 Echo: EF 25-30%.    Past Surgical History:  Procedure Laterality Date  . CARDIAC CATHETERIZATION     [place]  . CARDIAC DEFIBRILLATOR PLACEMENT  08/08/11   SJM Fortify ST VR implanted by Dr Selena Batten at [facility]  . CORONARY ARTERY BYPASS GRAFT  07/28/11   LIMA to OM, SVG to LAD, SVGF to PDA  . LEFT HEART CATH AND CORS/GRAFTS ANGIOGRAPHY Left 12/14/2017   Procedure: LEFT HEART CATH AND CORS/GRAFTS ANGIOGRAPHY;  Surgeon: [name], MD;  Location: [facility] INVASIVE CV LAB;  Service: Cardiovascular;  Laterality: Left;     Current Outpatient Medications  Medication Sig Dispense Refill  . aspirin 81 MG tablet Take 81 mg by mouth daily.    . carvedilol (COREG) 6.25 MG tablet TAKE 1 TABLET (6.25 MG TOTAL) BY MOUTH 2 (TWO) TIMES  DAILY. 180 tablet 2  . cyclobenzaprine (FLEXERIL) 10 MG tablet Take 1 tablet (10 mg total) by mouth 3 (three) times daily as needed for muscle spasms. 90 tablet 2  . ENTRESTO 24-26 MG TAKE 1 TABLET BY MOUTH 2 (TWO) TIMES DAILY. 180 tablet 1  . eplerenone (INSPRA) 25 MG tablet TAKE 1 TABLET BY MOUTH EVERY DAY 30 tablet 3  . magnesium oxide (MAG-OX) 400 MG tablet Take 1 tablet (400 mg total) by mouth 2 (two) times daily. 6 tablet 0  . Multiple Vitamin (MULTIVITAMIN) capsule Take 1 capsule by mouth daily.    . nitroGLYCERIN (NITROSTAT) 0.4 MG SL tablet Place 1 tablet (0.4 mg total) every 5 (five)  minutes as needed under the tongue for chest pain. 25 tablet 3  . Omega-3 Fatty Acids ([employer] FISH OIL) 1000 MG CAPS Take 3,000 mg by mouth daily. 90 capsule   . pantoprazole (PROTONIX) 40 MG tablet Take 1 tablet (40 mg total) by mouth daily. 30 tablet 11  . rosuvastatin (CRESTOR) 40 MG tablet TAKE 1 TABLET BY MOUTH EVERY DAY 30 tablet 3  . torsemide (DEMADEX) 10 MG tablet Take 1 tablet (10 mg) once daily as needed for increased weight gain/ shortness of breath/ swelling 10 tablet 6  . torsemide (DEMADEX) 20 MG tablet Take 1 tablet (20 mg total) by mouth every morning. 90 tablet 3  . ezetimibe (ZETIA) 10 MG tablet Take 1 tablet (10 mg total) by mouth daily. 90 tablet 3   No current facility-administered medications for this visit.     Allergies:   Penicillins    Social History:  The patient  reports that he quit smoking about 6 years ago. His smoking use included cigarettes. He has a 15.00 pack-year smoking history. He has never used smokeless tobacco. He reports that he drank alcohol. He reports that he does not use drugs.   Family History:  The patient's family history includes Cancer in his father.    ROS:  Please see the history of present illness.   Otherwise, review of systems are positive for none.   All other systems are reviewed and negative.    PHYSICAL EXAM: VS:  BP 106/70 (BP Location: Left Arm, Patient Position: Sitting, Cuff Size: Normal)   Pulse 73   Ht 6' (1.829 m)   Wt 220 lb (99.8 kg)   BMI 29.84 kg/m  , BMI Body mass index is 29.84 kg/m. GEN: Well nourished, well developed, in no acute distress  HEENT: normal  Neck: no JVD, carotid bruits, or masses Cardiac: RRR; no murmurs, rubs, or gallops.  trace edema bilaterally.  Respiratory:  clear to auscultation bilaterally, normal work of breathing GI: soft, nontender, nondistended, + BS MS: no deformity or atrophy  Skin: warm and dry, no rash Neuro:  Strength and sensation are intact Psych: euthymic mood, full  affect   EKG:  EKG is not ordered today.    Recent Labs: 10/26/2017: ALT 54 12/10/2017: Hemoglobin 16.3; Platelets 188 01/13/2018: BUN 15; Creatinine, Ser 1.01; Potassium 3.9; Sodium 141    Lipid Panel    Component Value Date/Time   CHOL 112 10/26/2017 0852   TRIG 178 (H) 10/26/2017 0852   HDL 40 10/26/2017 0852   CHOLHDL 2.8 10/26/2017 0852   LDLCALC 36 10/26/2017 0852      Wt Readings from Last 3 Encounters:  03/19/18 220 lb (99.8 kg)  01/13/18 220 lb 8 oz (100 kg)  12/14/17 221 lb (100.2 kg)  ASSESSMENT AND PLAN:  1. Coronary artery disease involving native coronary arteries with stable angina: Symptoms are well controlled with medications.  He has chronically occluded RCA with collaterals.  Continue medical therapy.   Some of his nocturnal symptoms might have been related to GERD and responded to Protonix.  2.  Chronic systolic heart failure:  Currently New [place] Heart Association class II.He appears to be euvolemic on small dose of torsemide. He is on optimal medical therapy.     3. Hyperlipidemia: Lipid profile improved significantly with the addition of Zetia.  Continue this along with high-dose rosuvastatin.  He has mildly elevated triglyceride.  The plan is to repeat his labs in about 6 months.  If triglyceride is above 150, I recommend switching from over-the-counter fish oil to Vascepa.    Disposition:   FU with me in 6 month.  Signed,  [name], MD  03/19/2018 10:41 AM    [facility]

## 2018-03-19 NOTE — Patient Instructions (Signed)
Medication Instructions: Continue same medications.   Labwork: None.   Procedures/Testing: None.   Follow-Up: 6 months with Dr. Heinz Eckert.   Any Additional Special Instructions Will Be Listed Below (If Applicable).     If you need a refill on your cardiac medications before your next appointment, please call your pharmacy.   

## 2018-03-24 ENCOUNTER — Other Ambulatory Visit: Payer: Self-pay | Admitting: Cardiovascular Disease

## 2018-05-13 ENCOUNTER — Other Ambulatory Visit: Payer: Self-pay | Admitting: Cardiovascular Disease

## 2018-05-13 DIAGNOSIS — I5022 Chronic systolic (congestive) heart failure: Secondary | ICD-10-CM

## 2018-05-19 ENCOUNTER — Encounter: Payer: BC Managed Care – PPO | Admitting: *Deleted

## 2018-05-19 ENCOUNTER — Telehealth: Payer: Self-pay | Admitting: Cardiology

## 2018-05-19 NOTE — Telephone Encounter (Signed)
Spoke with pt and reminded pt of remote transmission that is due today. Pt verbalized understanding.   

## 2018-05-20 ENCOUNTER — Other Ambulatory Visit: Payer: Self-pay | Admitting: Cardiovascular Disease

## 2018-05-24 ENCOUNTER — Ambulatory Visit (INDEPENDENT_AMBULATORY_CARE_PROVIDER_SITE_OTHER): Payer: BC Managed Care – PPO | Admitting: *Deleted

## 2018-05-24 ENCOUNTER — Encounter: Payer: Self-pay | Admitting: Internal Medicine

## 2018-05-24 DIAGNOSIS — I255 Ischemic cardiomyopathy: Secondary | ICD-10-CM | POA: Diagnosis not present

## 2018-05-24 NOTE — Progress Notes (Signed)
Remote ICD transmission.   

## 2018-05-25 ENCOUNTER — Encounter: Payer: Self-pay | Admitting: Cardiology

## 2018-05-28 LAB — CUP PACEART REMOTE DEVICE CHECK
Date Time Interrogation Session: 20190624183141
HighPow Impedance: 49 Ohm
Implantable Lead Implant Date: 20120907
Implantable Lead Location: 753860
Implantable Pulse Generator Implant Date: 20120907
Lead Channel Pacing Threshold Amplitude: 1 V
Lead Channel Pacing Threshold Pulse Width: 0.5 ms
Lead Channel Setting Sensing Sensitivity: 0.5 mV
MDC IDC MSMT BATTERY REMAINING LONGEVITY: 53 mo
MDC IDC MSMT BATTERY REMAINING PERCENTAGE: 45 %
MDC IDC MSMT BATTERY VOLTAGE: 2.92 V
MDC IDC MSMT LEADCHNL RV IMPEDANCE VALUE: 440 Ohm
MDC IDC MSMT LEADCHNL RV SENSING INTR AMPL: 12 mV
MDC IDC SET LEADCHNL RV PACING AMPLITUDE: 2.5 V
MDC IDC SET LEADCHNL RV PACING PULSEWIDTH: 0.5 ms
MDC IDC STAT BRADY RV PERCENT PACED: 1 %
Pulse Gen Serial Number: 1027081

## 2018-06-30 ENCOUNTER — Encounter: Payer: Self-pay | Admitting: Cardiovascular Disease

## 2018-07-07 ENCOUNTER — Telehealth: Payer: Self-pay | Admitting: *Deleted

## 2018-07-07 NOTE — Telephone Encounter (Signed)
PA for Entresto has been initiated for Entresto 24-26 mg tablet through covermymeds. Awaiting approval.    PA for Sherryll Burger has been initiated for Entresto 24-26 mg tablet through covermymeds. Awaiting approval.

## 2018-07-07 NOTE — Telephone Encounter (Signed)
PA for Sherryll Burgerntresto has been initiated for Entresto 24-26 mg tablet through covermymeds. Awaiting approval.

## 2018-07-07 NOTE — Telephone Encounter (Signed)
Pt aware that [facility] has [facility]. Pt expressed his appreciation and was very understanding that it could take up to 72 hours for response.

## 2018-07-07 NOTE — Telephone Encounter (Signed)
Lmovm to notify pt that his insurance information has been updated in his chart.

## 2018-07-07 NOTE — Telephone Encounter (Signed)
Pt is returning your call

## 2018-07-15 NOTE — Telephone Encounter (Signed)
Pt has been approved for [employer].

## 2018-08-23 ENCOUNTER — Ambulatory Visit (INDEPENDENT_AMBULATORY_CARE_PROVIDER_SITE_OTHER): Payer: 59 | Admitting: *Deleted

## 2018-08-23 DIAGNOSIS — I255 Ischemic cardiomyopathy: Secondary | ICD-10-CM

## 2018-08-23 NOTE — Progress Notes (Signed)
Remote ICD transmission.   

## 2018-08-24 ENCOUNTER — Encounter: Payer: Self-pay | Admitting: Cardiology

## 2018-08-29 ENCOUNTER — Other Ambulatory Visit: Payer: Self-pay | Admitting: Cardiovascular Disease

## 2018-09-14 LAB — CUP PACEART REMOTE DEVICE CHECK
Battery Remaining Longevity: 50 mo
Battery Remaining Percentage: 43 %
Battery Voltage: 2.92 V
Date Time Interrogation Session: 20190923080034
HighPow Impedance: 49 Ohm
Implantable Lead Location: 753860
Implantable Pulse Generator Implant Date: 20120907
Lead Channel Sensing Intrinsic Amplitude: 12 mV
Lead Channel Setting Pacing Amplitude: 2.5 V
Lead Channel Setting Pacing Pulse Width: 0.5 ms
MDC IDC LEAD IMPLANT DT: 20120907
MDC IDC MSMT LEADCHNL RV IMPEDANCE VALUE: 450 Ohm
MDC IDC MSMT LEADCHNL RV PACING THRESHOLD AMPLITUDE: 1 V
MDC IDC MSMT LEADCHNL RV PACING THRESHOLD PULSEWIDTH: 0.5 ms
MDC IDC PG SERIAL: 1027081
MDC IDC SET LEADCHNL RV SENSING SENSITIVITY: 0.5 mV
MDC IDC STAT BRADY RV PERCENT PACED: 1 %

## 2018-09-16 ENCOUNTER — Telehealth: Payer: Self-pay | Admitting: Cardiovascular Disease

## 2018-09-16 NOTE — Telephone Encounter (Signed)
Recieved request from : [employer]   Scanned to release   Forwarded to [employer] for processing

## 2018-09-21 ENCOUNTER — Other Ambulatory Visit: Payer: Self-pay | Admitting: Cardiovascular Disease

## 2018-10-01 ENCOUNTER — Encounter: Payer: Self-pay | Admitting: Cardiovascular Disease

## 2018-10-01 ENCOUNTER — Ambulatory Visit: Payer: BC Managed Care – PPO | Admitting: Cardiovascular Disease

## 2018-10-01 VITALS — BP 94/60 | HR 78 | Ht 72.0 in | Wt 234.5 lb

## 2018-10-01 DIAGNOSIS — I25118 Atherosclerotic heart disease of native coronary artery with other forms of angina pectoris: Secondary | ICD-10-CM

## 2018-10-01 DIAGNOSIS — I5022 Chronic systolic (congestive) heart failure: Secondary | ICD-10-CM | POA: Diagnosis not present

## 2018-10-01 DIAGNOSIS — Z23 Encounter for immunization: Secondary | ICD-10-CM

## 2018-10-01 DIAGNOSIS — E785 Hyperlipidemia, unspecified: Secondary | ICD-10-CM | POA: Diagnosis not present

## 2018-10-01 MED ORDER — POTASSIUM CHLORIDE ER 20 MEQ PO TBCR: 20.0000 meq | EXTENDED_RELEASE_TABLET | Freq: Every day | ORAL | 1 refills | Status: DC

## 2018-10-01 MED ORDER — TORSEMIDE 20 MG PO TABS: 20.0000 mg | ORAL_TABLET | Freq: Two times a day (BID) | ORAL | 3 refills | Status: DC

## 2018-10-01 NOTE — Progress Notes (Signed)
Cardiology Office Note   Date:  10/01/2018   ID:  Roy Turner, DOB 1971-09-09, MRN [id number]  PCP:  Patient, No Pcp Per  Cardiologist:   [name], MD   Chief Complaint  Patient presents with  . other    6 month f/u c/o feeling bloated. Meds reviewed verbally with pt.      History of Present Illness: Roy Turner is a 47 y.o. male who presents for  a followup visit regarding coronary artery disease and chronic systolic heart failure.  He has known h/o CAD s/p anterior STEMI with cardiogenic shock in 2012 .  cardiac catheterization showed 100% LAD occlusion, 100% RCA occlusion, and 80% LCx stenosis.  Intervention was performed but per report, this was aborted due to dissection.  He underwent CABG on 07/28/11.  2 days later, he had a VF arrest.  Emergent cath revealed patent CABG grafts but CAD distal to graft touchdown.  He underwent implantation of a SJM Fortify ST VR ICD 08/08/11.  He has done reasonably well since that time.    He had a myoview 4/15 which revealed EF 29%.   Most recent echocardiogram in April 2017 showed an ejection fraction of 25-30% with akinesis of the anteroseptal, anterior, anterolateral and apical myocardium. He had worsening nocturnal angina earlier this year.  Cardiac catheterization was done in January, 2019 which showed significant underlying three-vessel coronary artery disease with patent LIMA to OM 2 and SVG to LAD.  SVG to RCA was occluded with chronically occluded native right coronary artery with right to right and left to right collaterals.  LVEDP was normal.  He has been traveling frequently for work recently and has not been paying attention to low-sodium diet.  As a result, he developed progressive weight gain especially over the last 2 weeks.  This has been associated with significant abdominal swelling and bloating in addition to some leg edema and worsening dyspnea.  According to our scale, he gained 14 pounds.  No significant chest discomfort.   He takes torsemide 20 mg daily.  Past Medical History:  Diagnosis Date  . CAD (coronary artery disease)    a. 07/2011 Anterior STEMI w/ CGS/Cath: LAD 100, LCX 80, RCA 100-->PCI aborted 2/2 dissection-->CABG x 3 (VG->LAD, Free LIMA->OM2, VG->RCA); b. 07/30/2011 Emergent Cath 2/2 VF arrest: patent grafts w/ CAD distal to graft touchdown; c. 03/2014 Neg MV, EF 29%; d. 12/2017 Cath: LM nl, LAD 90ost/p, 100p/m, D1 90, LCX 60, RCA 80p, 57m, 100d ISR. VG->LAD ok, FLIMA->OM2 ok, VG->RCA 100 w/ L->R collats->Med Rx  . H/O cardiac arrest    a. 07/2011 VF arrest 2 days following CABG.  . HFrEF (heart failure with reduced ejection fraction) (HCC)    a. 03/2016 Echo: EF 25-30%, antsept, ant, antlat, apical AK. Mildly dil LA.  Marland Kitchen Hyperlipidemia   . Ischemic cardiomyopathy    a. 08/2011 s/p SJM [id number] Fortify ST VR single lead AICD; b. 03/2016 Echo: EF 25-30%.    Past Surgical History:  Procedure Laterality Date  . CARDIAC CATHETERIZATION     [place]  . CARDIAC DEFIBRILLATOR PLACEMENT  08/08/11   SJM Fortify ST VR implanted by Dr Selena Batten at [facility]  . CORONARY ARTERY BYPASS GRAFT  07/28/11   LIMA to OM, SVG to LAD, SVGF to PDA  . LEFT HEART CATH AND CORS/GRAFTS ANGIOGRAPHY Left 12/14/2017   Procedure: LEFT HEART CATH AND CORS/GRAFTS ANGIOGRAPHY;  Surgeon: [name], MD;  Location: [facility] INVASIVE CV LAB;  Service: Cardiovascular;  Laterality: Left;     Current Outpatient Medications  Medication Sig Dispense Refill  . aspirin 81 MG tablet Take 81 mg by mouth daily.    . carvedilol (COREG) 6.25 MG tablet TAKE 1 TABLET (6.25 MG TOTAL) BY MOUTH 2 (TWO) TIMES DAILY. 180 tablet 2  . cyclobenzaprine (FLEXERIL) 10 MG tablet Take 1 tablet (10 mg total) by mouth 3 (three) times daily as needed for muscle spasms. 90 tablet 2  . ENTRESTO 24-26 MG TAKE 1 TABLET BY MOUTH 2 (TWO) TIMES DAILY. 180 tablet 2  . eplerenone (INSPRA) 25 MG tablet TAKE 1 TABLET BY MOUTH EVERY DAY 30 tablet 5  . ezetimibe  (ZETIA) 10 MG tablet TAKE 1 TABLET BY MOUTH EVERY DAY 90 tablet 0  . magnesium oxide (MAG-OX) 400 MG tablet Take 1 tablet (400 mg total) by mouth 2 (two) times daily. 6 tablet 0  . Multiple Vitamin (MULTIVITAMIN) capsule Take 1 capsule by mouth daily.    . nitroGLYCERIN (NITROSTAT) 0.4 MG SL tablet Place 1 tablet (0.4 mg total) every 5 (five) minutes as needed under the tongue for chest pain. 25 tablet 3  . Omega-3 Fatty Acids ([employer] FISH OIL) 1000 MG CAPS Take 3,000 mg by mouth daily. 90 capsule   . pantoprazole (PROTONIX) 40 MG tablet Take 1 tablet (40 mg total) by mouth daily. 30 tablet 11  . rosuvastatin (CRESTOR) 40 MG tablet TAKE 1 TABLET BY MOUTH EVERY DAY 90 tablet 0  . torsemide (DEMADEX) 10 MG tablet Take 1 tablet (10 mg) once daily as needed for increased weight gain/ shortness of breath/ swelling 10 tablet 6  . torsemide (DEMADEX) 20 MG tablet Take 1 tablet (20 mg total) by mouth every morning. 90 tablet 3   No current facility-administered medications for this visit.     Allergies:   Penicillins    Social History:  The patient  reports that he quit smoking about 7 years ago. His smoking use included cigarettes. He has a 15.00 pack-year smoking history. He has never used smokeless tobacco. He reports that he drank alcohol. He reports that he does not use drugs.   Family History:  The patient's family history includes Cancer in his father.    ROS:  Please see the history of present illness.   Otherwise, review of systems are positive for none.   All other systems are reviewed and negative.    PHYSICAL EXAM: VS:  BP 94/60 (BP Location: Left Arm, Patient Position: Sitting, Cuff Size: Normal)   Pulse 78   Ht 6' (1.829 m)   Wt 234 lb 8 oz (106.4 kg)   BMI 31.80 kg/m  , BMI Body mass index is 31.8 kg/m. GEN: Well nourished, well developed, in no acute distress  HEENT: normal  Neck: Mild JVD, carotid bruits, or masses Cardiac: RRR; no murmurs, rubs, or gallops.  trace edema  bilaterally.  Respiratory:  clear to auscultation bilaterally, normal work of breathing GI: soft, nontender+ BS.  Mildly distended likely with some ascites. MS: no deformity or atrophy  Skin: warm and dry, no rash Neuro:  Strength and sensation are intact Psych: euthymic mood, full affect   EKG:  EKG is  ordered today. EKG showed normal sinus rhythm with old anteroseptal infarct.   Recent Labs: 10/26/2017: ALT 54 12/10/2017: Hemoglobin 16.3; Platelets 188 01/13/2018: BUN 15; Creatinine, Ser 1.01; Potassium 3.9; Sodium 141    Lipid Panel    Component Value Date/Time   CHOL 112 10/26/2017 0852   TRIG  178 (H) 10/26/2017 0852   HDL 40 10/26/2017 0852   CHOLHDL 2.8 10/26/2017 0852   LDLCALC 36 10/26/2017 0852      Wt Readings from Last 3 Encounters:  10/01/18 234 lb 8 oz (106.4 kg)  03/19/18 220 lb (99.8 kg)  01/13/18 220 lb 8 oz (100 kg)         ASSESSMENT AND PLAN:  1. Coronary artery disease involving native coronary arteries with stable angina: Symptoms are well controlled with medications.  He has chronically occluded RCA with collaterals.  Continue medical therapy.    2.  Acute on chronic systolic heart failure: 14 pound weight gain since last visit with evidence of volume overload by exam.  This is likely due to increased sodium intake recently due to his frequent travel for work.  I discussed with him the importance of low-sodium diet.  I elected to increase torsemide to 20 mg twice daily at least until we get his weight back to baseline of 220 pounds.  I added small dose potassium chloride.  Check basic metabolic profile next week.   3. Hyperlipidemia: Lipid profile improved significantly with the addition of Zetia.  Continue this along with high-dose rosuvastatin.  He has mildly elevated triglyceride.  He has been taking 3 g of fish oil over-the-counter.  Check lipid and liver profile next week and if his triglyceride is still elevated, I recommend switching him to  Vascepa   Disposition:   FU with me in 3 month.  Signed,  [name], MD  10/01/2018 2:09 PM    [facility]

## 2018-10-01 NOTE — Patient Instructions (Signed)
Medication Instructions:  INCREASE the Torsemide to 20 mg twice daily START Potassium 20 mEq once daily  If you need a refill on your cardiac medications before your next appointment, please call your pharmacy.   Lab work: Your provider would like for you to return on Tuesday to have the following labs drawn: FASTING Bmet, liver and lipid. Please go to the [facility] entrance and check in at the front desk. You do not need an appointment.   If you have labs (blood work) drawn today and your tests are completely normal, you will receive your results only by: Marland Kitchen MyChart Message (if you have MyChart) OR . A paper copy in the mail If you have any lab test that is abnormal or we need to change your treatment, we will call you to review the results.  Testing/Procedures: None ordered  Follow-Up: At [facility], you and your health needs are our priority.  As part of our continuing mission to provide you with exceptional heart care, we have created designated Provider Care Teams.  These Care Teams include your primary Cardiologist (physician) and Advanced Practice Providers (APPs -  Physician Assistants and Nurse Practitioners) who all work together to provide you with the care you need, when you need it. You will need a follow up appointment in 3 months. You may see [name], MD or one of the following Advanced Practice Providers on your designated Care Team:   [name], NP [name], PA-C . Marisue Ivan, PA-C

## 2018-10-05 ENCOUNTER — Other Ambulatory Visit (INDEPENDENT_AMBULATORY_CARE_PROVIDER_SITE_OTHER): Payer: 59 | Admitting: *Deleted

## 2018-10-05 DIAGNOSIS — I5022 Chronic systolic (congestive) heart failure: Secondary | ICD-10-CM | POA: Diagnosis not present

## 2018-10-05 DIAGNOSIS — I25118 Atherosclerotic heart disease of native coronary artery with other forms of angina pectoris: Secondary | ICD-10-CM

## 2018-10-05 DIAGNOSIS — E785 Hyperlipidemia, unspecified: Secondary | ICD-10-CM

## 2018-10-06 LAB — BASIC METABOLIC PANEL
BUN / CREAT RATIO: 15 (ref 9–20)
BUN: 17 mg/dL (ref 6–24)
CHLORIDE: 97 mmol/L (ref 96–106)
CO2: 22 mmol/L (ref 20–29)
Calcium: 9.6 mg/dL (ref 8.7–10.2)
Creatinine, Ser: 1.16 mg/dL (ref 0.76–1.27)
GFR, EST AFRICAN AMERICAN: 87 mL/min/{1.73_m2} (ref >59)
GFR, EST NON AFRICAN AMERICAN: 75 mL/min/{1.73_m2} (ref >59)
Glucose: 106 mg/dL — ABNORMAL HIGH (ref 65–99)
POTASSIUM: 4.5 mmol/L (ref 3.5–5.2)
SODIUM: 143 mmol/L (ref 134–144)

## 2018-10-06 LAB — HEPATIC FUNCTION PANEL
ALBUMIN: 5.1 g/dL (ref 3.5–5.5)
ALK PHOS: 74 IU/L (ref 39–117)
ALT: 29 IU/L (ref 0–44)
AST: 24 IU/L (ref 0–40)
BILIRUBIN TOTAL: 0.7 mg/dL (ref 0.0–1.2)
Bilirubin, Direct: 0.19 mg/dL (ref 0.00–0.40)
Total Protein: 7 g/dL (ref 6.0–8.5)

## 2018-10-06 LAB — LIPID PANEL
CHOL/HDL RATIO: 3.4 ratio (ref 0.0–5.0)
Cholesterol, Total: 131 mg/dL (ref 100–199)
HDL: 38 mg/dL — ABNORMAL LOW (ref >39)
LDL Calculated: 58 mg/dL (ref 0–99)
Triglycerides: 176 mg/dL — ABNORMAL HIGH (ref 0–149)
VLDL Cholesterol Cal: 35 mg/dL (ref 5–40)

## 2018-11-10 ENCOUNTER — Other Ambulatory Visit: Payer: Self-pay | Admitting: Cardiovascular Disease

## 2018-11-10 DIAGNOSIS — I5022 Chronic systolic (congestive) heart failure: Secondary | ICD-10-CM

## 2018-11-22 ENCOUNTER — Ambulatory Visit (INDEPENDENT_AMBULATORY_CARE_PROVIDER_SITE_OTHER): Payer: 59

## 2018-11-22 DIAGNOSIS — I255 Ischemic cardiomyopathy: Secondary | ICD-10-CM

## 2018-11-22 DIAGNOSIS — I5022 Chronic systolic (congestive) heart failure: Secondary | ICD-10-CM

## 2018-11-22 NOTE — Progress Notes (Signed)
Remote ICD transmission.   

## 2018-11-24 LAB — CUP PACEART REMOTE DEVICE CHECK
Brady Statistic RV Percent Paced: 1 %
HighPow Impedance: 51 Ohm
Implantable Lead Implant Date: 20120907
Implantable Lead Location: 753860
Implantable Pulse Generator Implant Date: 20120907
Lead Channel Impedance Value: 450 Ohm
Lead Channel Pacing Threshold Amplitude: 1 V
Lead Channel Pacing Threshold Pulse Width: 0.5 ms
Lead Channel Sensing Intrinsic Amplitude: 12 mV
MDC IDC MSMT BATTERY REMAINING LONGEVITY: 48 mo
MDC IDC MSMT BATTERY REMAINING PERCENTAGE: 41 %
MDC IDC MSMT BATTERY VOLTAGE: 2.9 V
MDC IDC PG SERIAL: 1027081
MDC IDC SESS DTM: 20191223090017
MDC IDC SET LEADCHNL RV PACING AMPLITUDE: 2.5 V
MDC IDC SET LEADCHNL RV PACING PULSEWIDTH: 0.5 ms
MDC IDC SET LEADCHNL RV SENSING SENSITIVITY: 0.5 mV

## 2018-11-25 ENCOUNTER — Other Ambulatory Visit: Payer: Self-pay | Admitting: Cardiovascular Disease

## 2018-12-07 ENCOUNTER — Telehealth: Payer: Self-pay | Admitting: Cardiovascular Disease

## 2018-12-07 ENCOUNTER — Other Ambulatory Visit: Payer: Self-pay

## 2018-12-07 MED ORDER — TORSEMIDE 20 MG PO TABS: 20.0000 mg | ORAL_TABLET | Freq: Two times a day (BID) | ORAL | 3 refills | Status: DC

## 2018-12-07 NOTE — Telephone Encounter (Signed)
Requested Prescriptions   Signed Prescriptions Disp Refills  . torsemide (DEMADEX) 20 MG tablet 90 tablet 3    Sig: Take 1 tablet (20 mg total) by mouth 2 (two) times daily.    Authorizing Provider: [name] A    Ordering User: Margrett Rud

## 2018-12-07 NOTE — Telephone Encounter (Signed)
°*  STAT* If patient is at the pharmacy, call can be transferred to refill team.   1. Which medications need to be refilled? (please list name of each medication and dose if known) torsemide   2. Which pharmacy/location (including street and city if local pharmacy) is medication to be sent to? [facility]  3. Do they need a 30 day or 90 day supply? 90  New rx in November never received please resend

## 2018-12-07 NOTE — Telephone Encounter (Signed)
Requested Prescriptions   Signed Prescriptions Disp Refills  . torsemide (DEMADEX) 20 MG tablet 90 tablet 3    Sig: Take 1 tablet (20 mg total) by mouth 2 (two) times daily.    Authorizing Provider: ARIDA, MUHAMMAD A    Ordering User: Gracyn Allor N   

## 2018-12-22 ENCOUNTER — Other Ambulatory Visit: Payer: Self-pay | Admitting: Cardiovascular Disease

## 2018-12-28 ENCOUNTER — Other Ambulatory Visit: Payer: Self-pay | Admitting: Cardiovascular Disease

## 2019-01-07 ENCOUNTER — Ambulatory Visit: Payer: 59 | Admitting: Cardiovascular Disease

## 2019-01-07 VITALS — BP 104/60 | HR 91 | Ht 72.0 in | Wt 233.0 lb

## 2019-01-07 DIAGNOSIS — I5022 Chronic systolic (congestive) heart failure: Secondary | ICD-10-CM | POA: Diagnosis not present

## 2019-01-07 DIAGNOSIS — E785 Hyperlipidemia, unspecified: Secondary | ICD-10-CM | POA: Diagnosis not present

## 2019-01-07 DIAGNOSIS — I25118 Atherosclerotic heart disease of native coronary artery with other forms of angina pectoris: Secondary | ICD-10-CM

## 2019-01-07 NOTE — Progress Notes (Signed)
Cardiology Office Note   Date:  01/07/2019   ID:  Roy Turner, DOB [birth year], MRN [id number]  PCP:  Patient, No Pcp Per  Cardiologist:   [name], MD   Chief Complaint  Patient presents with  . Other    3 month follow up. Patient Denies cehst pain and SOB. Meds reviewed verbally with patient,.      History of Present Illness: Roy Turner is a 48 y.o. male who presents for  a followup visit regarding coronary artery disease and chronic systolic heart failure.  He has known h/o CAD s/p anterior STEMI with cardiogenic shock in 2012 .  cardiac catheterization showed 100% LAD occlusion, 100% RCA occlusion, and 80% LCx stenosis.  Intervention was performed but per report, this was aborted due to dissection.  He underwent CABG on 07/28/11.  2 days later, he had a VF arrest.  Emergent cath revealed patent CABG grafts but CAD distal to graft touchdown.  He underwent implantation of a SJM Fortify ST VR ICD 08/08/11.  He has done reasonably well since that time.    He had a myoview 4/15 which revealed EF 29%.   Most recent echocardiogram in April 2017 showed an ejection fraction of 25-30% with akinesis of the anteroseptal, anterior, anterolateral and apical myocardium.  Cardiac catheterization was done in January, 2019 showed significant underlying three-vessel coronary artery disease with patent LIMA to OM 2 and SVG to LAD.  SVG to RCA was occluded with chronically occluded native right coronary artery with right to right and left to right collaterals.  LVEDP was normal.  The patient had some weight gain last visit and I increase his torsemide to twice daily.  He has not been able to lose any weight.  Unfortunately, he was laid off work recently.  He denies any chest pain or shortness of breath.  He has been taking his medications regularly.  Past Medical History:  Diagnosis Date  . CAD (coronary artery disease)    a. 07/2011 Anterior STEMI w/ CGS/Cath: LAD 100, LCX 80, RCA 100-->PCI aborted  2/2 dissection-->CABG x 3 (VG->LAD, Free LIMA->OM2, VG->RCA); b. 07/30/2011 Emergent Cath 2/2 VF arrest: patent grafts w/ CAD distal to graft touchdown; c. 03/2014 Neg MV, EF 29%; d. 12/2017 Cath: LM nl, LAD 90ost/p, 100p/m, D1 90, LCX 60, RCA 80p, 63m, 100d ISR. VG->LAD ok, FLIMA->OM2 ok, VG->RCA 100 w/ L->R collats->Med Rx  . H/O cardiac arrest    a. 07/2011 VF arrest 2 days following CABG.  . HFrEF (heart failure with reduced ejection fraction) (HCC)    a. 03/2016 Echo: EF 25-30%, antsept, ant, antlat, apical AK. Mildly dil LA.  Marland Kitchen Hyperlipidemia   . Ischemic cardiomyopathy    a. 08/2011 s/p SJM [id number] Fortify ST VR single lead AICD; b. 03/2016 Echo: EF 25-30%.    Past Surgical History:  Procedure Laterality Date  . CARDIAC CATHETERIZATION     [place]  . CARDIAC DEFIBRILLATOR PLACEMENT  08/08/11   SJM Fortify ST VR implanted by Dr Selena Batten at [facility]  . CORONARY ARTERY BYPASS GRAFT  07/28/11   LIMA to OM, SVG to LAD, SVGF to PDA  . LEFT HEART CATH AND CORS/GRAFTS ANGIOGRAPHY Left 12/14/2017   Procedure: LEFT HEART CATH AND CORS/GRAFTS ANGIOGRAPHY;  Surgeon: [name], MD;  Location: [facility] INVASIVE CV LAB;  Service: Cardiovascular;  Laterality: Left;     Current Outpatient Medications  Medication Sig Dispense Refill  . aspirin 81 MG tablet Take 81 mg by mouth  daily.    . carvedilol (COREG) 6.25 MG tablet TAKE 1 TABLET (6.25 MG TOTAL) BY MOUTH 2 (TWO) TIMES DAILY. 180 tablet 3  . cyclobenzaprine (FLEXERIL) 10 MG tablet Take 1 tablet (10 mg total) by mouth 3 (three) times daily as needed for muscle spasms. 90 tablet 2  . ENTRESTO 24-26 MG TAKE 1 TABLET BY MOUTH 2 (TWO) TIMES DAILY. 180 tablet 2  . eplerenone (INSPRA) 25 MG tablet TAKE 1 TABLET BY MOUTH EVERY DAY 90 tablet 1  . ezetimibe (ZETIA) 10 MG tablet TAKE 1 TABLET BY MOUTH EVERY DAY 90 tablet 3  . magnesium oxide (MAG-OX) 400 MG tablet Take 1 tablet (400 mg total) by mouth 2 (two) times daily. 6 tablet 0  .  Multiple Vitamin (MULTIVITAMIN) capsule Take 1 capsule by mouth daily.    . nitroGLYCERIN (NITROSTAT) 0.4 MG SL tablet Place 1 tablet (0.4 mg total) every 5 (five) minutes as needed under the tongue for chest pain. 25 tablet 3  . Omega-3 Fatty Acids ([employer]S FISH OIL) 1000 MG CAPS Take 3,000 mg by mouth daily. 90 capsule   . pantoprazole (PROTONIX) 40 MG tablet TAKE 1 TABLET BY MOUTH EVERY DAY 90 tablet 3  . potassium chloride 20 MEQ TBCR Take 20 mEq by mouth daily. 90 tablet 1  . rosuvastatin (CRESTOR) 40 MG tablet TAKE 1 TABLET BY MOUTH EVERY DAY 30 tablet 1  . torsemide (DEMADEX) 10 MG tablet Take 1 tablet (10 mg) once daily as needed for increased weight gain/ shortness of breath/ swelling 10 tablet 6  . torsemide (DEMADEX) 20 MG tablet Take 1 tablet (20 mg total) by mouth 2 (two) times daily. 90 tablet 3   No current facility-administered medications for this visit.     Allergies:   Penicillins    Social History:  The patient  reports that he quit smoking about 7 years ago. His smoking use included cigarettes. He has a 15.00 pack-year smoking history. He has never used smokeless tobacco. He reports previous alcohol use. He reports that he does not use drugs.   Family History:  The patient's family history includes Cancer in his father.    ROS:  Please see the history of present illness.   Otherwise, review of systems are positive for none.   All other systems are reviewed and negative.    PHYSICAL EXAM: VS:  BP 104/60 (BP Location: Left Arm, Patient Position: Sitting, Cuff Size: Normal)   Pulse 91   Ht 6' (1.829 m)   Wt 233 lb (105.7 kg)   BMI 31.60 kg/m  , BMI Body mass index is 31.6 kg/m. GEN: Well nourished, well developed, in no acute distress  HEENT: normal  Neck: Mild JVD, carotid bruits, or masses Cardiac: RRR; no murmurs, rubs, or gallops.  trace edema bilaterally.  Respiratory:  clear to auscultation bilaterally, normal work of breathing GI: soft, nontender+ BS.  Mildly  distended likely with some ascites. MS: no deformity or atrophy  Skin: warm and dry, no rash Neuro:  Strength and sensation are intact Psych: euthymic mood, full affect   EKG:  EKG is  ordered today. EKG showed normal sinus rhythm with old anteroseptal infarct.  Prolonged QT interval.   Recent Labs: 10/05/2018: ALT 29; BUN 17; Creatinine, Ser 1.16; Potassium 4.5; Sodium 143    Lipid Panel    Component Value Date/Time   CHOL 131 10/05/2018 0912   TRIG 176 (H) 10/05/2018 0912   HDL 38 (L) 10/05/2018 0912   CHOLHDL  3.4 10/05/2018 0912   LDLCALC 58 10/05/2018 0912      Wt Readings from Last 3 Encounters:  01/07/19 233 lb (105.7 kg)  10/01/18 234 lb 8 oz (106.4 kg)  03/19/18 220 lb (99.8 kg)         ASSESSMENT AND PLAN:  1. Coronary artery disease involving native coronary arteries with stable angina: Symptoms are well controlled with medications.  He has chronically occluded RCA with collaterals.  Continue medical therapy.    2.  Chronic systolic heart failure: Appears to be euvolemic on current dose of torsemide.  He is on optimal medical therapy and currently [place] heart association class II. I discussed with him the importance of healthy lifestyle changes including regular exercise and weight loss.  He started going to the gym recently.   3. Hyperlipidemia: Continue treatment with high-dose rosuvastatin and Zetia.  Most recent lipid profile showed an LDL of 58.  His triglyceride was still mildly elevated at 176.  I discussed with him the option of switching over-the-counter fish oil to Vascepa but right now he wants to focus on healthy lifestyle changes.   Disposition:   FU with me in 6 months.  Signed,  [name], MD  01/07/2019 2:31 PM    [facility]

## 2019-01-07 NOTE — Patient Instructions (Signed)
Medication Instructions:  No changes  If you need a refill on your cardiac medications before your next appointment, please call your pharmacy.   Lab work: None If you have labs (blood work) drawn today and your tests are completely normal, you will receive your results only by: Marland Kitchen MyChart Message (if you have MyChart) OR . A paper copy in the mail If you have any lab test that is abnormal or we need to change your treatment, we will call you to review the results.  Testing/Procedures: None  Follow-Up: At [facility], you and your health needs are our priority.  As part of our continuing mission to provide you with exceptional heart care, we have created designated Provider Care Teams.  These Care Teams include your primary Cardiologist (physician) and Advanced Practice Providers (APPs -  Physician Assistants and Nurse Practitioners) who all work together to provide you with the care you need, when you need it. You will need a follow up appointment in 6 months.  Please call our office 2 months in advance to schedule this appointment.  You may see [name], MD or one of the following Advanced Practice Providers on your designated Care Team:   [name], NP [name], PA-C . Marisue Ivan, PA-C  Any Other Special Instructions Will Be Listed Below (If Applicable).

## 2019-01-11 ENCOUNTER — Other Ambulatory Visit: Payer: Self-pay

## 2019-01-11 MED ORDER — PANTOPRAZOLE SODIUM 40 MG PO TBEC: 40.0000 mg | DELAYED_RELEASE_TABLET | Freq: Every day | ORAL | 11 refills | Status: DC

## 2019-01-13 ENCOUNTER — Other Ambulatory Visit: Payer: Self-pay | Admitting: *Deleted

## 2019-01-13 MED ORDER — PANTOPRAZOLE SODIUM 40 MG PO TBEC: 40.0000 mg | DELAYED_RELEASE_TABLET | Freq: Every day | ORAL | 3 refills | Status: DC

## 2019-01-13 NOTE — Progress Notes (Signed)
Called and spoke with pharmacy and they gave me number to his insurance carrier. Called and reviewed clinical information for prior authorization for patients protonix and it was approved and processed. Will send mychart message to patient with this information.   Starting today until 01/14/2020  Approval [id number]

## 2019-01-14 NOTE — Progress Notes (Signed)
Prior Berkley Harvey has been approved from 01/13/2019-01/14/2020

## 2019-01-17 ENCOUNTER — Other Ambulatory Visit: Payer: Self-pay | Admitting: Cardiovascular Disease

## 2019-01-21 ENCOUNTER — Telehealth: Payer: Self-pay | Admitting: Internal Medicine

## 2019-01-21 NOTE — Telephone Encounter (Signed)
Recieved request from : [employer]   Scanned to ROI   Forwarded to [employer] for processing

## 2019-02-11 ENCOUNTER — Other Ambulatory Visit: Payer: Self-pay | Admitting: Cardiovascular Disease

## 2019-02-21 ENCOUNTER — Ambulatory Visit (INDEPENDENT_AMBULATORY_CARE_PROVIDER_SITE_OTHER): Payer: 59 | Admitting: *Deleted

## 2019-02-21 ENCOUNTER — Other Ambulatory Visit: Payer: Self-pay

## 2019-02-21 DIAGNOSIS — I5022 Chronic systolic (congestive) heart failure: Secondary | ICD-10-CM

## 2019-02-21 DIAGNOSIS — I255 Ischemic cardiomyopathy: Secondary | ICD-10-CM

## 2019-02-22 LAB — CUP PACEART REMOTE DEVICE CHECK
Battery Remaining Longevity: 46 mo
Battery Remaining Percentage: 39 %
Battery Voltage: 2.9 V
Date Time Interrogation Session: 20200323121954
HighPow Impedance: 51 Ohm
Implantable Lead Implant Date: 20120907
Implantable Lead Location: 753860
Implantable Pulse Generator Implant Date: 20120907
Lead Channel Impedance Value: 450 Ohm
Lead Channel Pacing Threshold Amplitude: 1 V
Lead Channel Pacing Threshold Pulse Width: 0.5 ms
Lead Channel Sensing Intrinsic Amplitude: 12 mV
Lead Channel Setting Pacing Pulse Width: 0.5 ms
Lead Channel Setting Sensing Sensitivity: 0.5 mV
MDC IDC SET LEADCHNL RV PACING AMPLITUDE: 2.5 V
MDC IDC STAT BRADY RV PERCENT PACED: 1 %
Pulse Gen Serial Number: 1027081

## 2019-03-01 NOTE — Progress Notes (Signed)
Remote ICD transmission.   

## 2019-03-18 IMAGING — CR DG CHEST 2V
2 series · 2 of 2 positions shown · non-contrast
Comparison: None.

CLINICAL DATA: Coronary artery disease and ischemic cardiomyopathy.
Pre-op respiratory exam for cardiac catheterization

EXAM:
CHEST  2 VIEW

[chest pa]
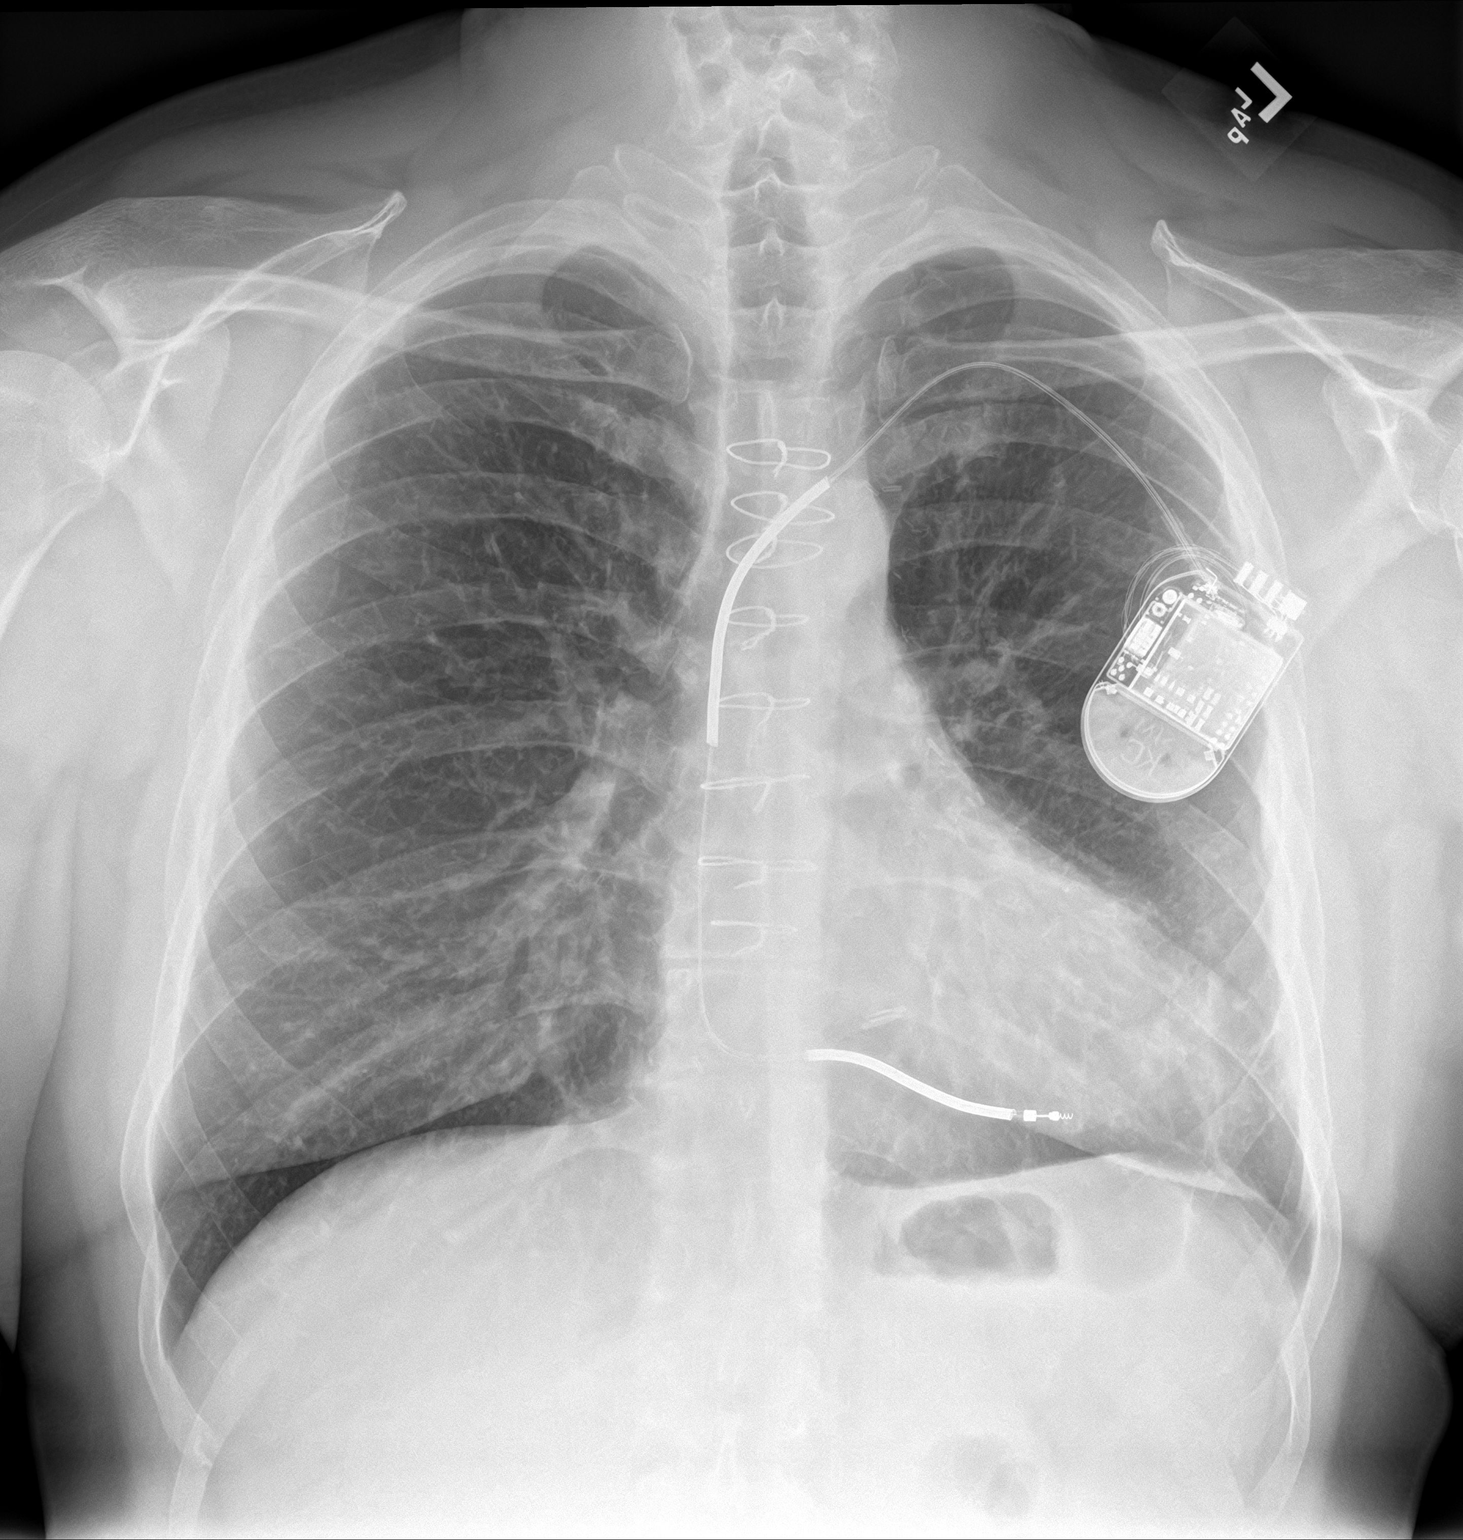

[chest lat]
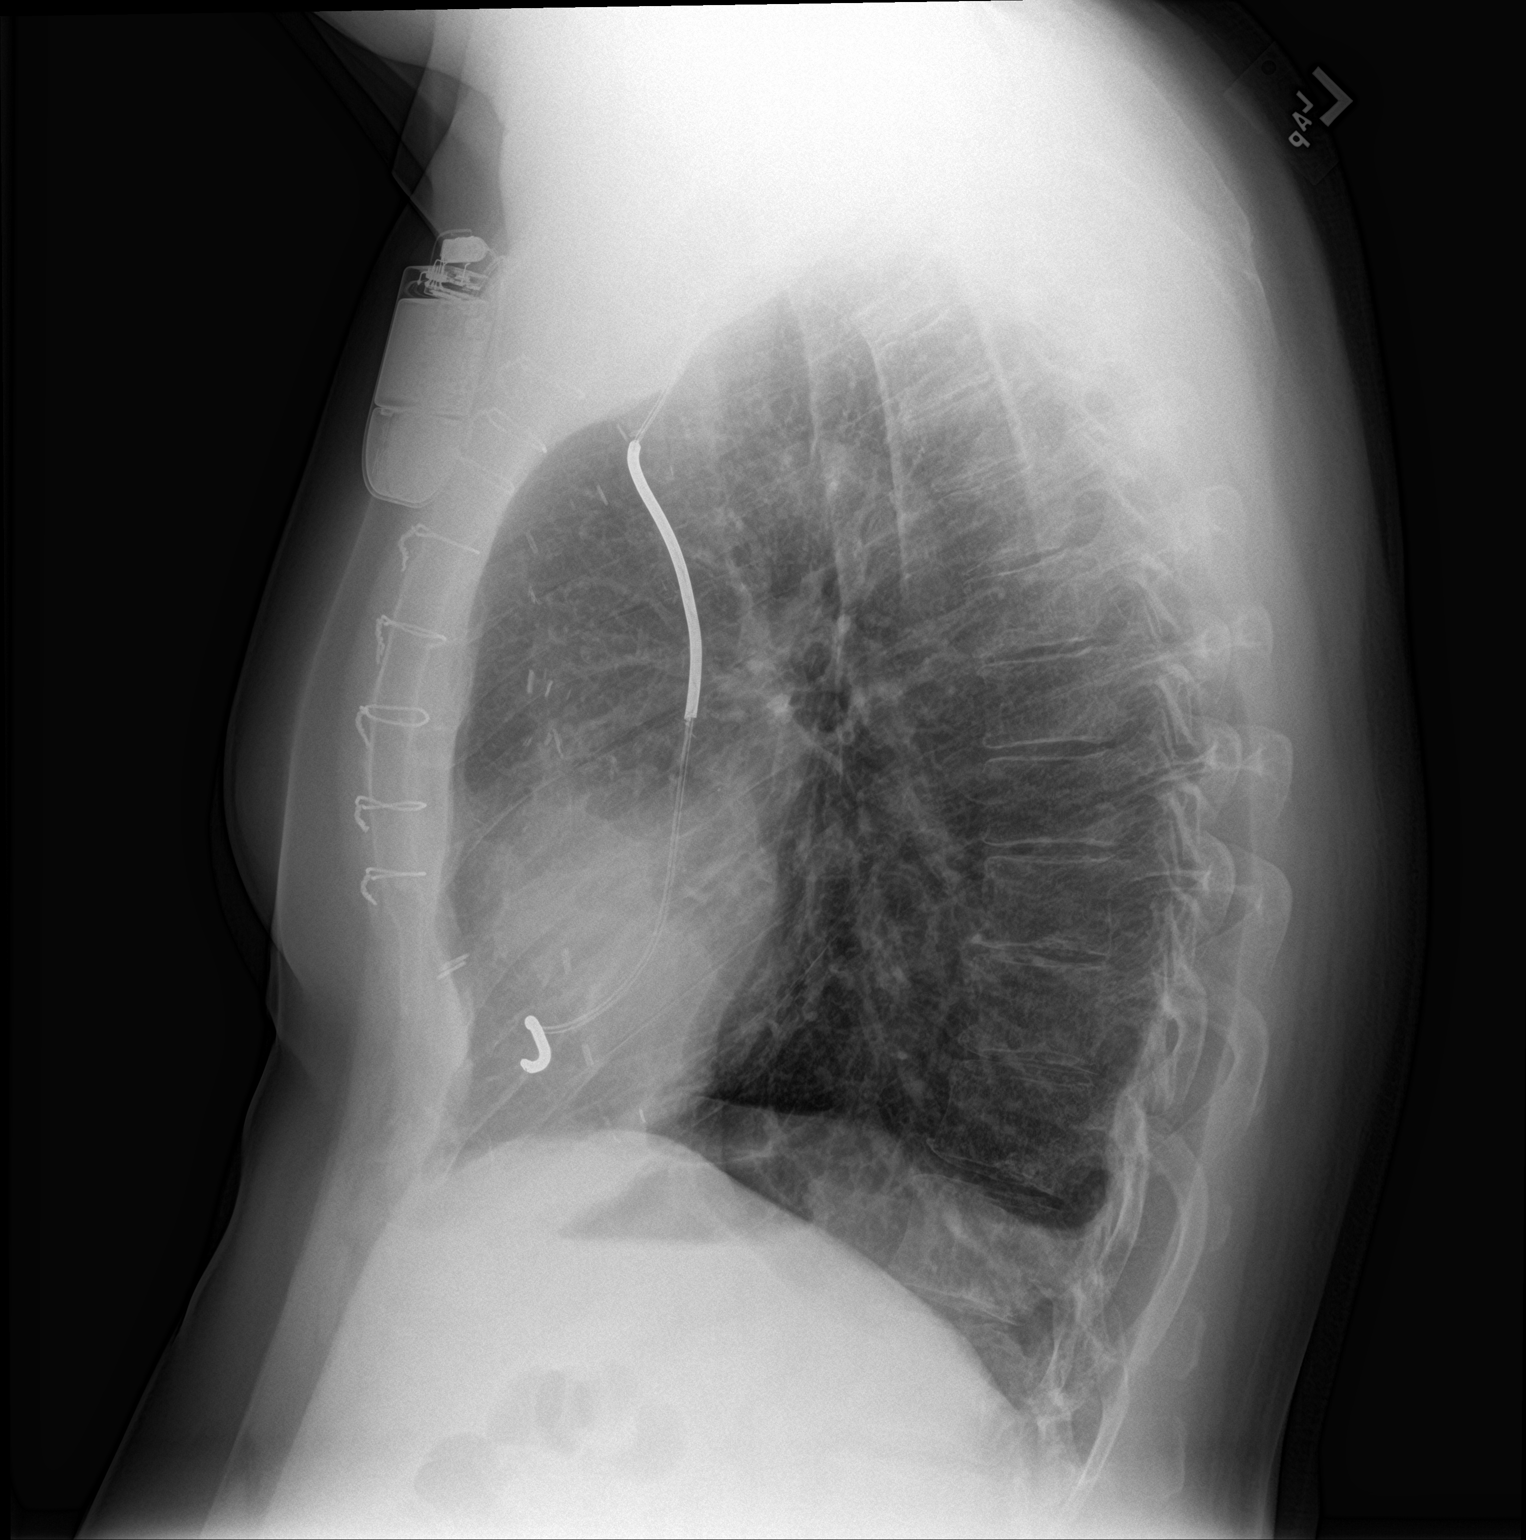

[2 of 2 positions shown; findings below may reference images not displayed]

FINDINGS: The heart size and mediastinal contours are within normal limits.
AICD is seen in appropriate position. Prior CABG.

Scarring is noted in lateral left lung base. No evidence of
pulmonary infiltrate or edema. No evidence of pleural effusion or
pneumothorax.
IMPRESSION: No active cardiopulmonary disease.

## 2019-03-31 ENCOUNTER — Other Ambulatory Visit: Payer: Self-pay

## 2019-03-31 MED ORDER — POTASSIUM CHLORIDE ER 20 MEQ PO TBCR: 20.0000 meq | EXTENDED_RELEASE_TABLET | Freq: Every day | ORAL | 1 refills | Status: DC

## 2019-05-23 ENCOUNTER — Ambulatory Visit (INDEPENDENT_AMBULATORY_CARE_PROVIDER_SITE_OTHER): Payer: 59 | Admitting: *Deleted

## 2019-05-23 DIAGNOSIS — I255 Ischemic cardiomyopathy: Secondary | ICD-10-CM

## 2019-05-23 DIAGNOSIS — I5022 Chronic systolic (congestive) heart failure: Secondary | ICD-10-CM

## 2019-05-23 LAB — CUP PACEART REMOTE DEVICE CHECK
Battery Remaining Longevity: 43 mo
Battery Remaining Percentage: 37 %
Battery Voltage: 2.9 V
Brady Statistic RV Percent Paced: 1 %
Date Time Interrogation Session: 20200622104848
HighPow Impedance: 51 Ohm
Implantable Lead Implant Date: 20120907
Implantable Lead Location: 753860
Implantable Pulse Generator Implant Date: 20120907
Lead Channel Impedance Value: 450 Ohm
Lead Channel Pacing Threshold Amplitude: 1 V
Lead Channel Pacing Threshold Pulse Width: 0.5 ms
Lead Channel Sensing Intrinsic Amplitude: 12 mV
Lead Channel Setting Pacing Amplitude: 2.5 V
Lead Channel Setting Pacing Pulse Width: 0.5 ms
Lead Channel Setting Sensing Sensitivity: 0.5 mV
Pulse Gen Serial Number: 1027081

## 2019-05-30 NOTE — Progress Notes (Signed)
Remote ICD transmission.   

## 2019-06-06 ENCOUNTER — Other Ambulatory Visit: Payer: Self-pay | Admitting: Cardiovascular Disease

## 2019-06-06 DIAGNOSIS — I5022 Chronic systolic (congestive) heart failure: Secondary | ICD-10-CM

## 2019-07-05 ENCOUNTER — Telehealth: Payer: Self-pay | Admitting: Cardiovascular Disease

## 2019-07-05 NOTE — Telephone Encounter (Signed)
New message:     Mel from Cover my Med's states that he was speaking with some one and got cut of. I do no not see a note.  [phone] ref number [id number].

## 2019-07-06 NOTE — Telephone Encounter (Signed)
No previous documentation regarding the nature of the call found. Returned the call to covermymeds and spoke with [place]. They generated another key for the pt PA for [facility].  PA completed on covermymeds. Awaiting decision from plan.

## 2019-07-07 ENCOUNTER — Telehealth: Payer: Self-pay | Admitting: *Deleted

## 2019-07-07 NOTE — Telephone Encounter (Signed)
PA has been submitted for Entresto 24/26 mg tablet. Pt has been approved for [facility] 07/06/2019-07/05/2020.

## 2019-07-07 NOTE — Telephone Encounter (Signed)
Reviewed Cover My Meds for PA update.  Message was received stating "an error occurred with your request."  Secure chatted Cover My Meds rep via the Cover My Meds site.  Per a rep, it looks like the plan may be sending a message that says it is too soon to refill this medication [name]).  We would need to reach out to the plan.  They provided me with a number to call [phone]. This directed me to [facility]. They advised me to call [employer] at [phone].   Phone rang, then I was disconnected.  Called back again, and spoke with an [employer] rep- I was told that the patient had a PA approved yesterday dated good from 07/06/2019-07/05/2020.  The patient did get a refill on entresto on 07/04/19 off of an old PA that expired yesterday.  His next refill is due on 07/28/19, but his PA is done and good from 07/06/2019-07/05/2020.

## 2019-08-02 ENCOUNTER — Other Ambulatory Visit: Payer: Self-pay | Admitting: Cardiovascular Disease

## 2019-08-02 DIAGNOSIS — I5022 Chronic systolic (congestive) heart failure: Secondary | ICD-10-CM

## 2019-08-22 LAB — CUP PACEART REMOTE DEVICE CHECK
Battery Remaining Longevity: 42 mo
Battery Remaining Percentage: 36 %
Battery Voltage: 2.89 V
Brady Statistic RV Percent Paced: 1 %
Date Time Interrogation Session: 20200921080018
HighPow Impedance: 55 Ohm
Implantable Lead Implant Date: 20120907
Implantable Lead Location: 753860
Implantable Pulse Generator Implant Date: 20120907
Lead Channel Impedance Value: 460 Ohm
Lead Channel Pacing Threshold Amplitude: 1 V
Lead Channel Pacing Threshold Pulse Width: 0.5 ms
Lead Channel Sensing Intrinsic Amplitude: 12 mV
Lead Channel Setting Pacing Amplitude: 2.5 V
Lead Channel Setting Pacing Pulse Width: 0.5 ms
Lead Channel Setting Sensing Sensitivity: 0.5 mV
Pulse Gen Serial Number: 1027081

## 2019-08-23 ENCOUNTER — Ambulatory Visit (INDEPENDENT_AMBULATORY_CARE_PROVIDER_SITE_OTHER): Payer: 59 | Admitting: *Deleted

## 2019-08-23 ENCOUNTER — Other Ambulatory Visit: Payer: Self-pay

## 2019-08-23 ENCOUNTER — Ambulatory Visit: Payer: 59 | Admitting: Cardiovascular Disease

## 2019-08-23 ENCOUNTER — Encounter: Payer: Self-pay | Admitting: Cardiovascular Disease

## 2019-08-23 VITALS — BP 116/70 | HR 68 | Ht 72.0 in | Wt 234.0 lb

## 2019-08-23 DIAGNOSIS — Z23 Encounter for immunization: Secondary | ICD-10-CM

## 2019-08-23 DIAGNOSIS — E782 Mixed hyperlipidemia: Secondary | ICD-10-CM | POA: Diagnosis not present

## 2019-08-23 DIAGNOSIS — I255 Ischemic cardiomyopathy: Secondary | ICD-10-CM

## 2019-08-23 DIAGNOSIS — I5022 Chronic systolic (congestive) heart failure: Secondary | ICD-10-CM

## 2019-08-23 DIAGNOSIS — I25708 Atherosclerosis of coronary artery bypass graft(s), unspecified, with other forms of angina pectoris: Secondary | ICD-10-CM

## 2019-08-23 MED ORDER — NITROGLYCERIN 0.4 MG SL SUBL: 0.4000 mg | SUBLINGUAL_TABLET | SUBLINGUAL | 3 refills | Status: DC | PRN

## 2019-08-23 MED ORDER — TORSEMIDE 10 MG PO TABS: ORAL_TABLET | ORAL | 6 refills | Status: DC

## 2019-08-23 NOTE — Patient Instructions (Signed)
Medication Instructions:  Your physician recommends that you continue on your current medications as directed. Please refer to the Current Medication list given to you today.  If you need a refill on your cardiac medications before your next appointment, please call your pharmacy.   Lab work: Your physician recommends that you return for a FASTING lipid profile and Cmet in 1-2 weeks.   Please have your labs drawn at the medical mall. Their hours are Mon- Fri 7am- 5:30pm  If you have labs (blood work) drawn today and your tests are completely normal, you will receive your results only by: Marland Kitchen MyChart Message (if you have MyChart) OR . A paper copy in the mail If you have any lab test that is abnormal or we need to change your treatment, we will call you to review the results.  Testing/Procedures: None ordered  Follow-Up: At [facility], you and your health needs are our priority.  As part of our continuing mission to provide you with exceptional heart care, we have created designated Provider Care Teams.  These Care Teams include your primary Cardiologist (physician) and Advanced Practice Providers (APPs -  Physician Assistants and Nurse Practitioners) who all work together to provide you with the care you need, when you need it. You will need a follow up appointment in 6 months.  Please call our office 2 months in advance to schedule this appointment.  You may see Kathlyn Sacramento, MD or one of the following Advanced Practice Providers on your designated Care Team:   Murray Hodgkins, NP Christell Faith, PA-C . Marrianne Mood, PA-C  Any Other Special Instructions Will Be Listed Below (If Applicable). N/A

## 2019-08-23 NOTE — Progress Notes (Signed)
Cardiology Office Note   Date:  08/23/2019   ID:  Roy Turner, DOB 10-29-1971, MRN [id number]  PCP:  Patient, No Pcp Per  Cardiologist:   [name], MD   Chief Complaint  Patient presents with  . other    6 mo f/u CAD. Medciations reviewed verbally.      History of Present Illness: Roy Turner is a 48 y.o. male who presents for a followup visit regarding coronary artery disease and chronic systolic heart failure.  He has known h/o CAD s/p anterior STEMI with cardiogenic shock in 2012. cardiac catheterization showed 100% LAD occlusion, 100% RCA occlusion, and 80% LCx stenosis.  Intervention was performed but per report, this was aborted due to dissection.  He underwent CABG on 07/28/11.  2 days later, he had a VF arrest.  Emergent cath revealed patent CABG grafts but CAD distal to graft touchdown.  He underwent implantation of a SJM Fortify ST VR ICD 08/08/11.  He has done reasonably well since that time.    He had a myoview 4/15 which revealed EF 29%.   Most recent echocardiogram in April 2017 showed an ejection fraction of 25-30% with akinesis of the anteroseptal, anterior, anterolateral and apical myocardium.  Cardiac catheterization was done in January, 2019 showed significant underlying three-vessel coronary artery disease with patent LIMA to OM 2 and SVG to LAD.  SVG to RCA was occluded with chronically occluded native right coronary artery with right to right and left to right collaterals.  LVEDP was normal.  He has been doing very well with no recent chest pain, shortness of breath or palpitations.  He is not exercising.  He did buy a treadmill but he has not been using it.  He reports no side effects with medications.  Past Medical History:  Diagnosis Date  . CAD (coronary artery disease)    a. 07/2011 Anterior STEMI w/ CGS/Cath: LAD 100, LCX 80, RCA 100-->PCI aborted 2/2 dissection-->CABG x 3 (VG->LAD, Free LIMA->OM2, VG->RCA); b. 07/30/2011 Emergent Cath 2/2 VF arrest:  patent grafts w/ CAD distal to graft touchdown; c. 03/2014 Neg MV, EF 29%; d. 12/2017 Cath: LM nl, LAD 90ost/p, 100p/m, D1 90, LCX 60, RCA 80p, 76m, 100d ISR. VG->LAD ok, FLIMA->OM2 ok, VG->RCA 100 w/ L->R collats->Med Rx  . H/O cardiac arrest    a. 07/2011 VF arrest 2 days following CABG.  . HFrEF (heart failure with reduced ejection fraction) (HCC)    a. 03/2016 Echo: EF 25-30%, antsept, ant, antlat, apical AK. Mildly dil LA.  Marland Kitchen Hyperlipidemia   . Ischemic cardiomyopathy    a. 08/2011 s/p SJM [id number] Fortify ST VR single lead AICD; b. 03/2016 Echo: EF 25-30%.    Past Surgical History:  Procedure Laterality Date  . CARDIAC CATHETERIZATION     [place]  . CARDIAC DEFIBRILLATOR PLACEMENT  08/08/11   SJM Fortify ST VR implanted by Dr Selena Batten at [facility]  . CORONARY ARTERY BYPASS GRAFT  07/28/11   LIMA to OM, SVG to LAD, SVGF to PDA  . LEFT HEART CATH AND CORS/GRAFTS ANGIOGRAPHY Left 12/14/2017   Procedure: LEFT HEART CATH AND CORS/GRAFTS ANGIOGRAPHY;  Surgeon: [name], MD;  Location: [facility] INVASIVE CV LAB;  Service: Cardiovascular;  Laterality: Left;     Current Outpatient Medications  Medication Sig Dispense Refill  . aspirin 81 MG tablet Take 81 mg by mouth daily.    . carvedilol (COREG) 6.25 MG tablet TAKE 1 TABLET (6.25 MG TOTAL) BY MOUTH 2 (TWO) TIMES  DAILY. 180 tablet 3  . cyclobenzaprine (FLEXERIL) 10 MG tablet Take 1 tablet (10 mg total) by mouth 3 (three) times daily as needed for muscle spasms. 90 tablet 2  . ENTRESTO 24-26 MG TAKE 1 TABLET BY MOUTH 2 (TWO) TIMES DAILY. 180 tablet 2  . eplerenone (INSPRA) 25 MG tablet TAKE 1 TABLET BY MOUTH EVERY DAY 30 tablet 1  . ezetimibe (ZETIA) 10 MG tablet TAKE 1 TABLET BY MOUTH EVERY DAY 90 tablet 3  . magnesium oxide (MAG-OX) 400 MG tablet Take 1 tablet (400 mg total) by mouth 2 (two) times daily. 6 tablet 0  . Multiple Vitamin (MULTIVITAMIN) capsule Take 1 capsule by mouth daily.    . nitroGLYCERIN (NITROSTAT) 0.4 MG  SL tablet Place 1 tablet (0.4 mg total) every 5 (five) minutes as needed under the tongue for chest pain. 25 tablet 3  . Omega-3 Fatty Acids ([employer] FISH OIL) 1000 MG CAPS Take 3,000 mg by mouth daily. 90 capsule   . pantoprazole (PROTONIX) 40 MG tablet Take 1 tablet (40 mg total) by mouth daily. 90 tablet 3  . Potassium Chloride ER 20 MEQ TBCR Take 20 mEq by mouth daily. 90 tablet 1  . rosuvastatin (CRESTOR) 40 MG tablet TAKE 1 TABLET BY MOUTH EVERY DAY 30 tablet 1  . torsemide (DEMADEX) 10 MG tablet Take 1 tablet (10 mg) once daily as needed for increased weight gain/ shortness of breath/ swelling 10 tablet 6  . torsemide (DEMADEX) 20 MG tablet Take 1 tablet (20 mg total) by mouth 2 (two) times daily. 90 tablet 3   No current facility-administered medications for this visit.     Allergies:   Penicillins    Social History:  The patient  reports that he quit smoking about 8 years ago. His smoking use included cigarettes. He has a 15.00 pack-year smoking history. He has never used smokeless tobacco. He reports previous alcohol use. He reports that he does not use drugs.   Family History:  The patient's family history includes Cancer in his father.    ROS:  Please see the history of present illness.   Otherwise, review of systems are positive for none.   All other systems are reviewed and negative.    PHYSICAL EXAM: VS:  BP 116/70 (BP Location: Left Arm, Patient Position: Sitting, Cuff Size: Large)   Pulse 68   Ht 6' (1.829 m)   Wt 234 lb (106.1 kg)   SpO2 97%   BMI 31.74 kg/m  , BMI Body mass index is 31.74 kg/m. GEN: Well nourished, well developed, in no acute distress  HEENT: normal  Neck: Mild JVD, carotid bruits, or masses Cardiac: RRR; no murmurs, rubs, or gallops.  trace edema bilaterally.  Respiratory:  clear to auscultation bilaterally, normal work of breathing GI: soft, nontender+ BS.  Mildly distended likely with some ascites. MS: no deformity or atrophy  Skin: warm and  dry, no rash Neuro:  Strength and sensation are intact Psych: euthymic mood, full affect   EKG:  EKG is  ordered today. EKG showed normal sinus rhythm with possible left atrial enlargement.  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   Recent Labs: 10/05/2018: ALT 29; BUN 17; Creatinine, Ser 1.16; Potassium 4.5; Sodium 143    Lipid Panel    Component Value Date/Time   CHOL 131 10/05/2018 0912   TRIG 176 (H) 10/05/2018 0912   HDL 38 (L) 10/05/2018 0912   CHOLHDL 3.4 10/05/2018 0912   LDLCALC 58 10/05/2018 0912  Wt Readings from Last 3 Encounters:  08/23/19 234 lb (106.1 kg)  01/07/19 233 lb (105.7 kg)  10/01/18 234 lb 8 oz (106.4 kg)         ASSESSMENT AND PLAN:  1. Coronary artery disease involving native coronary arteries with stable angina: Symptoms are well controlled with medications.  He has chronically occluded RCA with collaterals.  Continue medical therapy.    2.  Chronic systolic heart failure: Appears to be euvolemic on current dose of torsemide.  He is on optimal medical therapy and currently [place] heart association class II. I requested basic metabolic profile.  3. Hyperlipidemia: Continue treatment with high-dose rosuvastatin and Zetia.  Most recent lipid profile showed LDL below 70 but triglycerides was mildly elevated.  I am going to schedule him for fasting lipid and liver profile.  4.  Obesity: I discussed with him the importance of starting an exercise program.  He does have a treadmill at home.  He was given a flu shot today.  Disposition:   FU with me in 6 months.  Signed,  [name], MD  08/23/2019 4:31 PM    [facility]

## 2019-08-31 NOTE — Progress Notes (Signed)
Remote ICD transmission.   

## 2019-09-06 ENCOUNTER — Other Ambulatory Visit
Admission: RE | Admit: 2019-09-06 | Discharge: 2019-09-06 | Disposition: A | Discharge status: Home / Self Care | Payer: 59 | Source: Ambulatory Visit | Attending: Cardiovascular Disease | Admitting: Cardiovascular Disease

## 2019-09-06 DIAGNOSIS — I5022 Chronic systolic (congestive) heart failure: Secondary | ICD-10-CM | POA: Insufficient documentation

## 2019-09-06 DIAGNOSIS — E782 Mixed hyperlipidemia: Secondary | ICD-10-CM | POA: Diagnosis not present

## 2019-09-06 LAB — COMPREHENSIVE METABOLIC PANEL
ALT: 28 U/L (ref 0–44)
AST: 22 U/L (ref 15–41)
Albumin: 4.3 g/dL (ref 3.5–5.0)
Alkaline Phosphatase: 66 U/L (ref 38–126)
Anion gap: 10 (ref 5–15)
BUN: 20 mg/dL (ref 6–20)
CO2: 27 mmol/L (ref 22–32)
Calcium: 9.3 mg/dL (ref 8.9–10.3)
Chloride: 101 mmol/L (ref 98–111)
Creatinine, Ser: 0.96 mg/dL (ref 0.61–1.24)
GFR calc Af Amer: >60 mL/min (ref >60)
GFR calc non Af Amer: >60 mL/min (ref >60)
Glucose, Bld: 119 mg/dL — ABNORMAL HIGH (ref 70–99)
Potassium: 3.7 mmol/L (ref 3.5–5.1)
Sodium: 138 mmol/L (ref 135–145)
Total Bilirubin: 0.8 mg/dL (ref 0.3–1.2)
Total Protein: 6.7 g/dL (ref 6.5–8.1)

## 2019-09-06 LAB — LIPID PANEL
Cholesterol: 111 mg/dL (ref 0–200)
HDL: 34 mg/dL — ABNORMAL LOW (ref >40)
LDL Cholesterol: 35 mg/dL (ref 0–99)
Total CHOL/HDL Ratio: 3.3 RATIO
Triglycerides: 209 mg/dL — ABNORMAL HIGH (ref <150)
VLDL: 42 mg/dL — ABNORMAL HIGH (ref 0–40)

## 2019-09-24 ENCOUNTER — Other Ambulatory Visit: Payer: Self-pay | Admitting: Cardiovascular Disease

## 2019-09-24 DIAGNOSIS — I5022 Chronic systolic (congestive) heart failure: Secondary | ICD-10-CM

## 2019-09-26 ENCOUNTER — Other Ambulatory Visit: Payer: Self-pay | Admitting: *Deleted

## 2019-09-26 MED ORDER — TORSEMIDE 20 MG PO TABS: 20.0000 mg | ORAL_TABLET | Freq: Two times a day (BID) | ORAL | 3 refills | Status: DC

## 2019-09-28 ENCOUNTER — Other Ambulatory Visit: Payer: Self-pay | Admitting: Cardiovascular Disease

## 2019-10-02 ENCOUNTER — Other Ambulatory Visit: Payer: Self-pay | Admitting: Cardiovascular Disease

## 2019-10-03 ENCOUNTER — Telehealth: Payer: Self-pay

## 2019-10-03 NOTE — Telephone Encounter (Signed)
Key: [id number] Completed and submitted to [employer]. Awaiting approval.

## 2019-10-03 NOTE — Telephone Encounter (Signed)
Per fax received from [place], [place] request for pantoprazole has been approved from 10/03/2019 through 10/02/2020. PA Case ID [id number]

## 2019-10-12 ENCOUNTER — Other Ambulatory Visit: Payer: Self-pay | Admitting: Cardiovascular Disease

## 2019-11-01 ENCOUNTER — Other Ambulatory Visit: Payer: Self-pay | Admitting: Cardiovascular Disease

## 2019-11-19 ENCOUNTER — Other Ambulatory Visit: Payer: Self-pay | Admitting: Cardiovascular Disease

## 2019-11-22 ENCOUNTER — Ambulatory Visit (INDEPENDENT_AMBULATORY_CARE_PROVIDER_SITE_OTHER): Payer: 59 | Admitting: *Deleted

## 2019-11-22 DIAGNOSIS — I5022 Chronic systolic (congestive) heart failure: Secondary | ICD-10-CM | POA: Diagnosis not present

## 2019-11-22 LAB — CUP PACEART REMOTE DEVICE CHECK
Battery Remaining Longevity: 40 mo
Battery Remaining Percentage: 34 %
Battery Voltage: 2.87 V
Brady Statistic RV Percent Paced: 1 %
Date Time Interrogation Session: 20201221040018
HighPow Impedance: 52 Ohm
Implantable Lead Implant Date: 20120907
Implantable Lead Location: 753860
Implantable Pulse Generator Implant Date: 20120907
Lead Channel Impedance Value: 450 Ohm
Lead Channel Pacing Threshold Amplitude: 1 V
Lead Channel Pacing Threshold Pulse Width: 0.5 ms
Lead Channel Sensing Intrinsic Amplitude: 12 mV
Lead Channel Setting Pacing Amplitude: 2.5 V
Lead Channel Setting Pacing Pulse Width: 0.5 ms
Lead Channel Setting Sensing Sensitivity: 0.5 mV
Pulse Gen Serial Number: 1027081

## 2020-01-02 ENCOUNTER — Other Ambulatory Visit: Payer: Self-pay

## 2020-01-02 MED ORDER — EZETIMIBE 10 MG PO TABS: 10.0000 mg | ORAL_TABLET | Freq: Every day | ORAL | 1 refills | Status: DC

## 2020-01-26 ENCOUNTER — Other Ambulatory Visit: Payer: Self-pay | Admitting: Cardiovascular Disease

## 2020-01-30 ENCOUNTER — Other Ambulatory Visit: Payer: Self-pay | Admitting: *Deleted

## 2020-01-30 MED ORDER — CARVEDILOL 6.25 MG PO TABS: 6.2500 mg | ORAL_TABLET | Freq: Two times a day (BID) | ORAL | 1 refills | Status: DC

## 2020-01-30 NOTE — Telephone Encounter (Signed)
Rx has been sent to the pharmacy electronically. ° °

## 2020-01-31 ENCOUNTER — Other Ambulatory Visit: Payer: Self-pay | Admitting: Cardiovascular Disease

## 2020-02-21 ENCOUNTER — Ambulatory Visit (INDEPENDENT_AMBULATORY_CARE_PROVIDER_SITE_OTHER): Payer: 59 | Admitting: *Deleted

## 2020-02-21 DIAGNOSIS — I5022 Chronic systolic (congestive) heart failure: Secondary | ICD-10-CM | POA: Diagnosis not present

## 2020-02-22 LAB — CUP PACEART REMOTE DEVICE CHECK
Battery Remaining Longevity: 37 mo
Battery Remaining Percentage: 32 %
Battery Voltage: 2.86 V
Brady Statistic RV Percent Paced: 1 %
Date Time Interrogation Session: 20210324042616
HighPow Impedance: 53 Ohm
Implantable Lead Implant Date: 20120907
Implantable Lead Location: 753860
Implantable Pulse Generator Implant Date: 20120907
Lead Channel Impedance Value: 460 Ohm
Lead Channel Pacing Threshold Amplitude: 1 V
Lead Channel Pacing Threshold Pulse Width: 0.5 ms
Lead Channel Sensing Intrinsic Amplitude: 12 mV
Lead Channel Setting Pacing Amplitude: 2.5 V
Lead Channel Setting Pacing Pulse Width: 0.5 ms
Lead Channel Setting Sensing Sensitivity: 0.5 mV
Pulse Gen Serial Number: 1027081

## 2020-02-23 ENCOUNTER — Ambulatory Visit: Payer: 59 | Admitting: Cardiovascular Disease

## 2020-02-23 ENCOUNTER — Other Ambulatory Visit: Payer: Self-pay | Admitting: Cardiovascular Disease

## 2020-02-28 ENCOUNTER — Encounter: Payer: Self-pay | Admitting: Family

## 2020-02-28 ENCOUNTER — Ambulatory Visit: Payer: 59 | Admitting: Family

## 2020-02-28 ENCOUNTER — Other Ambulatory Visit: Payer: Self-pay

## 2020-02-28 VITALS — BP 116/72 | HR 73 | Ht 72.0 in | Wt 237.0 lb

## 2020-02-28 DIAGNOSIS — R0683 Snoring: Secondary | ICD-10-CM

## 2020-02-28 DIAGNOSIS — E785 Hyperlipidemia, unspecified: Secondary | ICD-10-CM | POA: Diagnosis not present

## 2020-02-28 DIAGNOSIS — I5022 Chronic systolic (congestive) heart failure: Secondary | ICD-10-CM

## 2020-02-28 DIAGNOSIS — I25708 Atherosclerosis of coronary artery bypass graft(s), unspecified, with other forms of angina pectoris: Secondary | ICD-10-CM | POA: Diagnosis not present

## 2020-02-28 MED ORDER — TORSEMIDE 20 MG PO TABS: ORAL_TABLET | ORAL | 1 refills | Status: DC

## 2020-02-28 NOTE — Patient Instructions (Addendum)
Medication Instructions:  Your physician has recommended you make the following change in your medication:   Refill of Torsemide sent to pharmacy.   Take 40mg  (2 tablets) daily in the morning and an extra 20mg  (1 tablet) in the afternoon as needed.  *If you need a refill on your cardiac medications before your next appointment, please call your pharmacy*   Lab Work: Your physician recommends that you return for lab work today: BMP  If you have labs (blood work) drawn today and your tests are completely normal, you will receive your results only by: MyChart Message (if you have MyChart) OR . A paper copy in the mail If you have any lab test that is abnormal or we need to change your treatment, we will call you to review the results.  Testing/Procedures: Your EKG today shows normal sinus rhythm and is stable.   Follow-Up: At [facility], you and your health needs are our priority.  As part of our continuing mission to provide you with exceptional heart care, we have created designated Provider Care Teams.  These Care Teams include your primary Cardiologist (physician) and Advanced Practice Providers (APPs -  Physician Assistants and Nurse Practitioners) who all work together to provide you with the care you need, when you need it.  We recommend signing up for the patient portal called "MyChart".  Sign up information is provided on this After Visit Summary.  MyChart is used to connect with patients for Virtual Visits (Telemedicine).  Patients are able to view lab/test results, encounter notes, upcoming appointments, etc.  Non-urgent messages can be sent to your provider as well.   To learn more about what you can do with MyChart, go to Marland Kitchen.    Your next appointment:   6 month(s)  The format for your next appointment:   In Person  Provider:   You may see [facility], MD or one of the following Advanced Practice Providers on your designated Care Team:     [email or web address], NP  [name], PA-C  [name], PA-C  Other Instructions   Continue low sodium, heart healthy diet.  Continue regular cardiovascular exercise.  We will be thinking of your son and your family.

## 2020-02-28 NOTE — Progress Notes (Signed)
Office Visit    Patient Name: Roy Turner Date of Encounter: 02/28/2020  Primary Care Provider:  Patient, No Pcp Per Primary Cardiologist:  [name], MD Electrophysiologist:  None   Chief Complaint    Wilbon Obenchain is a 49 y.o. male with a hx of CAD s/p CABG, VF arrest s/p ICD, chronic systolic heart failure, HLD, obesity presents today for follow up of CAD and heart failure.   Past Medical History    Past Medical History:  Diagnosis Date  . CAD (coronary artery disease)    a. 07/2011 Anterior STEMI w/ CGS/Cath: LAD 100, LCX 80, RCA 100-->PCI aborted 2/2 dissection-->CABG x 3 (VG->LAD, Free LIMA->OM2, VG->RCA); b. 07/30/2011 Emergent Cath 2/2 VF arrest: patent grafts w/ CAD distal to graft touchdown; c. 03/2014 Neg MV, EF 29%; d. 12/2017 Cath: LM nl, LAD 90ost/p, 100p/m, D1 90, LCX 60, RCA 80p, 77m, 100d ISR. VG->LAD ok, FLIMA->OM2 ok, VG->RCA 100 w/ L->R collats->Med Rx  . H/O cardiac arrest    a. 07/2011 VF arrest 2 days following CABG.  . HFrEF (heart failure with reduced ejection fraction) (HCC)    a. 03/2016 Echo: EF 25-30%, antsept, ant, antlat, apical AK. Mildly dil LA.  Marland Kitchen Hyperlipidemia   . Ischemic cardiomyopathy    a. 08/2011 s/p SJM [id number] Fortify ST VR single lead AICD; b. 03/2016 Echo: EF 25-30%.   Past Surgical History:  Procedure Laterality Date  . CARDIAC CATHETERIZATION     [place]  . CARDIAC DEFIBRILLATOR PLACEMENT  08/08/11   SJM Fortify ST VR implanted by Dr Selena Batten at [facility]  . CORONARY ARTERY BYPASS GRAFT  07/28/11   LIMA to OM, SVG to LAD, SVGF to PDA  . LEFT HEART CATH AND CORS/GRAFTS ANGIOGRAPHY Left 12/14/2017   Procedure: LEFT HEART CATH AND CORS/GRAFTS ANGIOGRAPHY;  Surgeon: [name], MD;  Location: [facility] INVASIVE CV LAB;  Service: Cardiovascular;  Laterality: Left;   Allergies  Allergies  Allergen Reactions  . Penicillins Hives    As a child   History of Present Illness    Roy Turner is a 49 y.o. male with a hx  of CAD s/p CABG, VF arrest s/p ICD, chronic systolic heart failure, HLD, obesity last seen 08/23/19 by Dr. Kirke Corin.  CAD s/p anterior STEMI and cardiogenic shock 2012. Cath with 100% LAD occlusion, 100% RCA occlusion, 80% LCx stenosis. Intervention performed but aborted due to dissection per report. Underwent CABG 07/28/11. Had VF arrest 2 days later with emergent cath revealing patent grafts but CAD distal to graft touchdown. Underwent implantation SJM FOrtify ST VR ICD 08/08/11. Myoview 03/2014 with LVEF 29%. Echo April 2017 LVEF 25-30% with akinesis anteroseptal, anetior, anterolateral and apical myocardium. Cath January 2019 with significant underlying 3-vessel CAD with patent LIMA to OM2 and SVG to LAD. SVG to RCA occluded with chronically occluded native RCA with right to right and left to right collaterals. LVEDP normal.   March 2021 ICD remote check with normal device function.   Tells me his son has a possible sarcoma in his ankle and has been undergoing workup. Endorses this has been stressful.   Tells me he has been having some trouble with wakefulness. Reports he often can take a daytime nap. Wakes feeling rested. Tells me he snores "a little bit". Never tested for sleep apnea. Tells me this is a longstanding issue.   LE edema and dyspnea well controlled by taking Torsemide 40mg  in the morning daily with an additional 20mg  in the afternoon as  needed. Was previously on 10mg  and reports it "did not work as well" over time.  Will wake up 237 lb, take 2 of the 20 tablets, then weight will go down to 232 lbs.   Reports no chest pain, pressure, tightness.   EKGs/Labs/Other Studies Reviewed:   The following studies were reviewed today:  EKG:  EKG is ordered today.  The ekg ordered today demonstrates SR 73 bpm with no acute ST/T wave changes.   Recent Labs: 09/06/2019: ALT 28; BUN 20; Creatinine, Ser 0.96; Potassium 3.7; Sodium 138  Recent Lipid Panel    Component Value Date/Time   CHOL 111  09/06/2019 1016   CHOL 131 10/05/2018 0912   TRIG 209 (H) 09/06/2019 1016   HDL 34 (L) 09/06/2019 1016   HDL 38 (L) 10/05/2018 0912   CHOLHDL 3.3 09/06/2019 1016   VLDL 42 (H) 09/06/2019 1016   LDLCALC 35 09/06/2019 1016   LDLCALC 58 10/05/2018 0912    Home Medications   Current Meds  Medication Sig  . aspirin 81 MG tablet Take 81 mg by mouth daily.  . carvedilol (COREG) 6.25 MG tablet Take 1 tablet (6.25 mg total) by mouth 2 (two) times daily.  . cyclobenzaprine (FLEXERIL) 10 MG tablet Take 1 tablet (10 mg total) by mouth 3 (three) times daily as needed for muscle spasms.  13/03/2018 ENTRESTO 24-26 MG TAKE 1 TABLET BY MOUTH TWICE A DAY  . eplerenone (INSPRA) 25 MG tablet TAKE 1 TABLET BY MOUTH EVERY DAY  . ezetimibe (ZETIA) 10 MG tablet TAKE 1 TABLET BY MOUTH EVERY DAY  . KLOR-CON M20 20 MEQ tablet TAKE 1 TABLET BY MOUTH EVERY DAY  . magnesium oxide (MAG-OX) 400 MG tablet Take 1 tablet (400 mg total) by mouth 2 (two) times daily.  . Multiple Vitamin (MULTIVITAMIN) capsule Take 1 capsule by mouth daily.  . nitroGLYCERIN (NITROSTAT) 0.4 MG SL tablet Place 1 tablet (0.4 mg total) under the tongue every 5 (five) minutes as needed for chest pain.  . Omega-3 Fatty Acids ([employer] FISH OIL) 1000 MG CAPS Take 3,000 mg by mouth daily.  . pantoprazole (PROTONIX) 40 MG tablet Take 1 tablet (40 mg total) by mouth daily.  . rosuvastatin (CRESTOR) 40 MG tablet TAKE 1 TABLET BY MOUTH EVERY DAY  . torsemide (DEMADEX) 20 MG tablet TAKE 1 TABLET BY MOUTH TWICE A DAY      Review of Systems       Review of Systems  Constitution: Negative for chills, fever and malaise/fatigue.  Cardiovascular: Positive for dyspnea on exertion and leg swelling. Negative for chest pain, near-syncope, orthopnea, palpitations and syncope.  Respiratory: Negative for cough, shortness of breath and wheezing.   Gastrointestinal: Negative for nausea and vomiting.  Neurological: Negative for dizziness, light-headedness and weakness.    All other systems reviewed and are otherwise negative except as noted above.  Physical Exam    VS:  BP 116/72 (BP Location: Left Arm, Patient Position: Sitting, Cuff Size: Normal)   Pulse 73   Ht 6' (1.829 m)   Wt 237 lb (107.5 kg)   SpO2 94%   BMI 32.14 kg/m  , BMI Body mass index is 32.14 kg/m. GEN: Well nourished, well developed, in no acute distress. HEENT: normal. Neck: Supple, no JVD, carotid bruits, or masses. Cardiac: RRR, no murmurs, rubs, or gallops. No clubbing, cyanosis, edema.  Radials/DP/PT 2+ and equal bilaterally.  Respiratory:  Respirations regular and unlabored, clear to auscultation bilaterally. GI: Soft, nontender, nondistended, BS + x 4.  MS: No deformity or atrophy. Skin: Warm and dry, no rash. Neuro:  Strength and sensation are intact. Psych: Normal affect.   Assessment & Plan    1. CAD s/p CABG and known chronically occluded RCA with collaterals - No chest pain, pressure, tightness. EKG today with no acute ST/T wave changes. No indication for ischemic evaluation at this time. Continue GDMT aspirin, beta blocker, statin, Zetia, PRN nitroglycerin.  2. Chronic systolic heart failure - Euvolemic and well compensated on exam. Will continue Torsemide at dose he has been using to control weight/swelling (40mg  QAM and 20MG  PRN in the afternoon). BMP today for monitoring. Low sodium, heart healthy diet recommended.   Low normal BP prohibits further escalation of heart failure therapies. GDMT includes Coreg, Entresto 24-75mh, Eplerenone, loop diuretic.   3. HLD, LDL goal <70 - LDL 09/2019 of 35, at goal of less than 70. Continue Crestor 40mg  daily and Zetia 10mg  daily.  4. Obesity - Weight loss via diet and exercise encouraged.  5. Snores - Reports snoring and daytime somnolence. Discussed recommendation for sleep study. He politely declines at this time due to his son's current workup.   Disposition: Follow up in 6 month(s) with Dr. Tor Netters,  NP 02/28/2020, 2:22 PM

## 2020-02-29 LAB — BASIC METABOLIC PANEL
BUN/Creatinine Ratio: 12 (ref 9–20)
BUN: 14 mg/dL (ref 6–24)
CO2: 24 mmol/L (ref 20–29)
Calcium: 9.9 mg/dL (ref 8.7–10.2)
Chloride: 99 mmol/L (ref 96–106)
Creatinine, Ser: 1.14 mg/dL (ref 0.76–1.27)
GFR calc Af Amer: 88 mL/min/{1.73_m2} (ref >59)
GFR calc non Af Amer: 76 mL/min/{1.73_m2} (ref >59)
Glucose: 104 mg/dL — ABNORMAL HIGH (ref 65–99)
Potassium: 4.5 mmol/L (ref 3.5–5.2)
Sodium: 141 mmol/L (ref 134–144)

## 2020-03-13 ENCOUNTER — Other Ambulatory Visit: Payer: Self-pay | Admitting: Cardiovascular Disease

## 2020-03-13 DIAGNOSIS — I5022 Chronic systolic (congestive) heart failure: Secondary | ICD-10-CM

## 2020-03-21 ENCOUNTER — Other Ambulatory Visit: Payer: Self-pay | Admitting: Cardiovascular Disease

## 2020-03-25 ENCOUNTER — Other Ambulatory Visit: Payer: Self-pay | Admitting: Cardiovascular Disease

## 2020-04-14 ENCOUNTER — Other Ambulatory Visit: Payer: Self-pay | Admitting: Cardiovascular Disease

## 2020-04-19 ENCOUNTER — Other Ambulatory Visit: Payer: Self-pay | Admitting: Cardiovascular Disease

## 2020-05-23 ENCOUNTER — Telehealth: Payer: Self-pay

## 2020-05-23 NOTE — Telephone Encounter (Signed)
Spoke with patient to remind of missed remote transmission 

## 2020-06-05 ENCOUNTER — Ambulatory Visit (INDEPENDENT_AMBULATORY_CARE_PROVIDER_SITE_OTHER): Payer: 59 | Admitting: *Deleted

## 2020-06-05 DIAGNOSIS — I255 Ischemic cardiomyopathy: Secondary | ICD-10-CM | POA: Diagnosis not present

## 2020-06-05 LAB — CUP PACEART REMOTE DEVICE CHECK
Battery Remaining Longevity: 32 mo
Battery Remaining Percentage: 28 %
Battery Voltage: 2.83 V
Brady Statistic RV Percent Paced: 1 %
Date Time Interrogation Session: 20210706005742
HighPow Impedance: 48 Ohm
Implantable Lead Implant Date: 20120907
Implantable Lead Location: 753860
Implantable Pulse Generator Implant Date: 20120907
Lead Channel Impedance Value: 450 Ohm
Lead Channel Pacing Threshold Amplitude: 1 V
Lead Channel Pacing Threshold Pulse Width: 0.5 ms
Lead Channel Sensing Intrinsic Amplitude: 12 mV
Lead Channel Setting Pacing Amplitude: 2.5 V
Lead Channel Setting Pacing Pulse Width: 0.5 ms
Lead Channel Setting Sensing Sensitivity: 0.5 mV
Pulse Gen Serial Number: 1027081

## 2020-06-06 NOTE — Progress Notes (Signed)
Remote ICD transmission.   

## 2020-06-15 ENCOUNTER — Other Ambulatory Visit: Payer: Self-pay | Admitting: Cardiovascular Disease

## 2020-06-22 ENCOUNTER — Other Ambulatory Visit: Payer: Self-pay | Admitting: Cardiovascular Disease

## 2020-07-05 ENCOUNTER — Other Ambulatory Visit: Payer: Self-pay | Admitting: Family

## 2020-07-05 DIAGNOSIS — I5022 Chronic systolic (congestive) heart failure: Secondary | ICD-10-CM

## 2020-07-11 ENCOUNTER — Other Ambulatory Visit: Payer: Self-pay | Admitting: Cardiovascular Disease

## 2020-07-22 ENCOUNTER — Other Ambulatory Visit: Payer: Self-pay | Admitting: Cardiovascular Disease

## 2020-08-03 ENCOUNTER — Other Ambulatory Visit: Payer: Self-pay | Admitting: Family

## 2020-08-03 DIAGNOSIS — I5022 Chronic systolic (congestive) heart failure: Secondary | ICD-10-CM

## 2020-08-16 ENCOUNTER — Other Ambulatory Visit: Payer: Self-pay

## 2020-08-16 ENCOUNTER — Encounter: Payer: Self-pay | Admitting: Cardiovascular Disease

## 2020-08-16 ENCOUNTER — Ambulatory Visit: Payer: 59 | Admitting: Cardiovascular Disease

## 2020-08-16 VITALS — BP 110/72 | HR 85 | Ht 72.0 in | Wt 251.5 lb

## 2020-08-16 DIAGNOSIS — I5022 Chronic systolic (congestive) heart failure: Secondary | ICD-10-CM

## 2020-08-16 DIAGNOSIS — E782 Mixed hyperlipidemia: Secondary | ICD-10-CM | POA: Diagnosis not present

## 2020-08-16 DIAGNOSIS — I25810 Atherosclerosis of coronary artery bypass graft(s) without angina pectoris: Secondary | ICD-10-CM | POA: Diagnosis not present

## 2020-08-16 NOTE — Progress Notes (Signed)
Cardiology Office Note   Date:  08/16/2020   ID:  Roy Turner, DOB 26-Nov-1971, MRN [id number]  PCP:  Pcp, No  Cardiologist:   [name], MD   Chief Complaint  Patient presents with  . office visit    6 month F/U; Meds verbally reviewed with patient.      History of Present Illness: Roy Turner is a 49 y.o. male who presents for a followup visit regarding coronary artery disease and chronic systolic heart failure.  He has known h/o CAD s/p anterior STEMI with cardiogenic shock in 2012. cardiac catheterization showed 100% LAD occlusion, 100% RCA occlusion, and 80% LCx stenosis.  Intervention was performed but per report, this was aborted due to dissection.  He underwent CABG on 07/28/11.  2 days later, he had a VF arrest.  Emergent cath revealed patent CABG grafts.  He underwent implantation of a SJM Fortify ST VR ICD 08/08/11.  He has done reasonably well since that time.    He had a myoview 4/15 which revealed EF 29%.   Most recent echocardiogram in April 2017 showed an ejection fraction of 25-30% with akinesis of the anteroseptal, anterior, anterolateral and apical myocardium.  Cardiac catheterization was done in January, 2019 showed significant underlying three-vessel coronary artery disease with patent LIMA to OM 2 and SVG to LAD.  SVG to RCA was occluded with chronically occluded native right coronary artery with right to right and left to right collaterals.  LVEDP was normal.  Has been doing very well with no recent chest pain, shortness of breath or palpitations.  Unfortunately, his 50 year old son was diagnosed with bone sarcoma with metastasis.  He is getting radiation and chemotherapy at [facility].  Obviously, the patient has been significantly stressed and gained about 12 pounds over the last 6 months.  Past Medical History:  Diagnosis Date  . CAD (coronary artery disease)    a. 07/2011 Anterior STEMI w/ CGS/Cath: LAD 100, LCX 80, RCA 100-->PCI aborted 2/2 dissection-->CABG x 3  (VG->LAD, Free LIMA->OM2, VG->RCA); b. 07/30/2011 Emergent Cath 2/2 VF arrest: patent grafts w/ CAD distal to graft touchdown; c. 03/2014 Neg MV, EF 29%; d. 12/2017 Cath: LM nl, LAD 90ost/p, 100p/m, D1 90, LCX 60, RCA 80p, 58m, 100d ISR. VG->LAD ok, FLIMA->OM2 ok, VG->RCA 100 w/ L->R collats->Med Rx  . H/O cardiac arrest    a. 07/2011 VF arrest 2 days following CABG.  . HFrEF (heart failure with reduced ejection fraction) (HCC)    a. 03/2016 Echo: EF 25-30%, antsept, ant, antlat, apical AK. Mildly dil LA.  Marland Kitchen Hyperlipidemia   . Ischemic cardiomyopathy    a. 08/2011 s/p SJM 1241-40Q Fortify ST VR single lead AICD; b. 03/2016 Echo: EF 25-30%.    Past Surgical History:  Procedure Laterality Date  . CARDIAC CATHETERIZATION     [place]  . CARDIAC DEFIBRILLATOR PLACEMENT  08/08/11   SJM Fortify ST VR implanted by Dr Selena Batten at [facility]  . CORONARY ARTERY BYPASS GRAFT  07/28/11   LIMA to OM, SVG to LAD, SVGF to PDA  . LEFT HEART CATH AND CORS/GRAFTS ANGIOGRAPHY Left 12/14/2017   Procedure: LEFT HEART CATH AND CORS/GRAFTS ANGIOGRAPHY;  Surgeon: [name], MD;  Location: [facility] INVASIVE CV LAB;  Service: Cardiovascular;  Laterality: Left;     Current Outpatient Medications  Medication Sig Dispense Refill  . aspirin 81 MG tablet Take 81 mg by mouth daily.    . carvedilol (COREG) 6.25 MG tablet TAKE 1 TABLET BY MOUTH  TWICE A DAY 180 tablet 1  . cyclobenzaprine (FLEXERIL) 10 MG tablet Take 1 tablet (10 mg total) by mouth 3 (three) times daily as needed for muscle spasms. 90 tablet 2  . ENTRESTO 24-26 MG TAKE 1 TABLET BY MOUTH TWICE A DAY 60 tablet 5  . eplerenone (INSPRA) 25 MG tablet TAKE 1 TABLET BY MOUTH EVERY DAY 30 tablet 5  . ezetimibe (ZETIA) 10 MG tablet TAKE 1 TABLET BY MOUTH EVERY DAY 90 tablet 0  . KLOR-CON M20 20 MEQ tablet TAKE 1 TABLET BY MOUTH EVERY DAY 90 tablet 0  . magnesium oxide (MAG-OX) 400 MG tablet Take 1 tablet (400 mg total) by mouth 2 (two) times daily. 6  tablet 0  . Multiple Vitamin (MULTIVITAMIN) capsule Take 1 capsule by mouth daily.    . nitroGLYCERIN (NITROSTAT) 0.4 MG SL tablet Place 1 tablet (0.4 mg total) under the tongue every 5 (five) minutes as needed for chest pain. 25 tablet 3  . Omega-3 Fatty Acids ([employer] FISH OIL) 1000 MG CAPS Take 3,000 mg by mouth daily. 90 capsule   . pantoprazole (PROTONIX) 40 MG tablet TAKE 1 TABLET BY MOUTH EVERY DAY 90 tablet 0  . rosuvastatin (CRESTOR) 40 MG tablet TAKE 1 TABLET BY MOUTH EVERY DAY 30 tablet 5  . torsemide (DEMADEX) 20 MG tablet TAKE TWO TABLETS (40MG ) DAILY IN THE MORNING AND ONE TABLET (20MG ) IN THE AFTERNOON AS-NEEDED. 90 tablet 0   No current facility-administered medications for this visit.    Allergies:   Penicillins    Social History:  The patient  reports that he quit smoking about 9 years ago. His smoking use included cigarettes. He has a 15.00 pack-year smoking history. He has never used smokeless tobacco. He reports previous alcohol use. He reports that he does not use drugs.   Family History:  The patient's family history includes Cancer in his father.    ROS:  Please see the history of present illness.   Otherwise, review of systems are positive for none.   All other systems are reviewed and negative.    PHYSICAL EXAM: VS:  BP 110/72 (BP Location: Left Arm, Patient Position: Sitting, Cuff Size: Large)   Pulse 85   Ht 6' (1.829 m)   Wt 251 lb 8 oz (114.1 kg)   SpO2 98%   BMI 34.11 kg/m  , BMI Body mass index is 34.11 kg/m. GEN: Well nourished, well developed, in no acute distress  HEENT: normal  Neck: Mild JVD, carotid bruits, or masses Cardiac: RRR; no murmurs, rubs, or gallops.  trace edema bilaterally.  Respiratory:  clear to auscultation bilaterally, normal work of breathing GI: soft, nontender+ BS.  Mildly distended likely with some ascites. MS: no deformity or atrophy  Skin: warm and dry, no rash Neuro:  Strength and sensation are intact Psych: euthymic  mood, full affect   EKG:  EKG is  ordered today. EKG showed normal sinus rhythm with old anterior infarct.   Recent Labs: 09/06/2019: ALT 28 02/28/2020: BUN 14; Creatinine, Ser 1.14; Potassium 4.5; Sodium 141    Lipid Panel    Component Value Date/Time   CHOL 111 09/06/2019 1016   CHOL 131 10/05/2018 0912   TRIG 209 (H) 09/06/2019 1016   HDL 34 (L) 09/06/2019 1016   HDL 38 (L) 10/05/2018 0912   CHOLHDL 3.3 09/06/2019 1016   VLDL 42 (H) 09/06/2019 1016   LDLCALC 35 09/06/2019 1016   LDLCALC 58 10/05/2018 0912  Wt Readings from Last 3 Encounters:  08/16/20 251 lb 8 oz (114.1 kg)  02/28/20 237 lb (107.5 kg)  08/23/19 234 lb (106.1 kg)         ASSESSMENT AND PLAN:  1. Coronary artery disease involving native coronary arteries with stable angina: Symptoms are well controlled with medications.  He has chronically occluded RCA with collaterals.  Continue medical therapy.    2.  Chronic systolic heart failure: He appears to be euvolemic on current dose of torsemide.  He is on optimal medical therapy and currently [place] heart association class II. I requested routine labs  3. Hyperlipidemia: Continue treatment with high-dose rosuvastatin and Zetia.  I requested lipid and liver profile   Disposition:   FU with me in 6 months.  Signed,  [name], MD  08/16/2020 3:40 PM    [facility]

## 2020-08-16 NOTE — Patient Instructions (Addendum)
Medication Instructions:  Your physician recommends that you continue on your current medications as directed. Please refer to the Current Medication list given to you today.  *If you need a refill on your cardiac medications before your next appointment, please call your pharmacy*   Lab Work: Lipid and Cmet today  If you have labs (blood work) drawn today and your tests are completely normal, you will receive your results only by: Marland Kitchen MyChart Message (if you have MyChart) OR . A paper copy in the mail If you have any lab test that is abnormal or we need to change your treatment, we will call you to review the results.   Testing/Procedures: None ordered   Follow-Up: At [facility], you and your health needs are our priority.  As part of our continuing mission to provide you with exceptional heart care, we have created designated Provider Care Teams.  These Care Teams include your primary Cardiologist (physician) and Advanced Practice Providers (APPs -  Physician Assistants and Nurse Practitioners) who all work together to provide you with the care you need, when you need it.  We recommend signing up for the patient portal called "MyChart".  Sign up information is provided on this After Visit Summary.  MyChart is used to connect with patients for Virtual Visits (Telemedicine).  Patients are able to view lab/test results, encounter notes, upcoming appointments, etc.  Non-urgent messages can be sent to your provider as well.   To learn more about what you can do with MyChart, go to [email or web address].    Your next appointment:   6 month(s)  The format for your next appointment:   In Person  Provider:    You may see [name], MD or one of the following Advanced Practice Providers on your designated Care Team:    [name], NP  [name], PA-C  Marisue Ivan, PA-C    Other Instructions N/A

## 2020-08-17 LAB — COMPREHENSIVE METABOLIC PANEL
ALT: 36 IU/L (ref 0–44)
AST: 23 IU/L (ref 0–40)
Albumin/Globulin Ratio: 2.5 — ABNORMAL HIGH (ref 1.2–2.2)
Albumin: 5.3 g/dL — ABNORMAL HIGH (ref 4.0–5.0)
Alkaline Phosphatase: 88 IU/L (ref 44–121)
BUN/Creatinine Ratio: 12 (ref 9–20)
BUN: 14 mg/dL (ref 6–24)
Bilirubin Total: 0.4 mg/dL (ref 0.0–1.2)
CO2: 27 mmol/L (ref 20–29)
Calcium: 9.8 mg/dL (ref 8.7–10.2)
Chloride: 98 mmol/L (ref 96–106)
Creatinine, Ser: 1.2 mg/dL (ref 0.76–1.27)
GFR calc Af Amer: 82 mL/min/{1.73_m2} (ref >59)
GFR calc non Af Amer: 71 mL/min/{1.73_m2} (ref >59)
Globulin, Total: 2.1 g/dL (ref 1.5–4.5)
Glucose: 131 mg/dL — ABNORMAL HIGH (ref 65–99)
Potassium: 3.8 mmol/L (ref 3.5–5.2)
Sodium: 141 mmol/L (ref 134–144)
Total Protein: 7.4 g/dL (ref 6.0–8.5)

## 2020-08-17 LAB — LIPID PANEL
Chol/HDL Ratio: 3.4 ratio (ref 0.0–5.0)
Cholesterol, Total: 134 mg/dL (ref 100–199)
HDL: 39 mg/dL — ABNORMAL LOW (ref >39)
LDL Chol Calc (NIH): 50 mg/dL (ref 0–99)
Triglycerides: 288 mg/dL — ABNORMAL HIGH (ref 0–149)
VLDL Cholesterol Cal: 45 mg/dL — ABNORMAL HIGH (ref 5–40)

## 2020-08-27 ENCOUNTER — Other Ambulatory Visit: Payer: Self-pay | Admitting: Cardiovascular Disease

## 2020-09-02 ENCOUNTER — Other Ambulatory Visit: Payer: Self-pay | Admitting: Cardiovascular Disease

## 2020-09-02 DIAGNOSIS — I5022 Chronic systolic (congestive) heart failure: Secondary | ICD-10-CM

## 2020-09-04 ENCOUNTER — Ambulatory Visit (INDEPENDENT_AMBULATORY_CARE_PROVIDER_SITE_OTHER): Payer: 59

## 2020-09-04 ENCOUNTER — Other Ambulatory Visit: Payer: Self-pay | Admitting: Family

## 2020-09-04 DIAGNOSIS — I255 Ischemic cardiomyopathy: Secondary | ICD-10-CM

## 2020-09-04 DIAGNOSIS — I5022 Chronic systolic (congestive) heart failure: Secondary | ICD-10-CM

## 2020-09-07 ENCOUNTER — Other Ambulatory Visit: Payer: Self-pay | Admitting: Cardiovascular Disease

## 2020-09-07 LAB — CUP PACEART REMOTE DEVICE CHECK
Battery Remaining Longevity: 31 mo
Battery Remaining Percentage: 27 %
Battery Voltage: 2.83 V
Brady Statistic RV Percent Paced: 1 %
Date Time Interrogation Session: 20211008053718
HighPow Impedance: 45 Ohm
Implantable Lead Implant Date: 20120907
Implantable Lead Location: 753860
Implantable Pulse Generator Implant Date: 20120907
Lead Channel Impedance Value: 450 Ohm
Lead Channel Pacing Threshold Amplitude: 1 V
Lead Channel Pacing Threshold Pulse Width: 0.5 ms
Lead Channel Sensing Intrinsic Amplitude: 12 mV
Lead Channel Setting Pacing Amplitude: 2.5 V
Lead Channel Setting Pacing Pulse Width: 0.5 ms
Lead Channel Setting Sensing Sensitivity: 0.5 mV
Pulse Gen Serial Number: 1027081

## 2020-09-07 NOTE — Progress Notes (Signed)
Remote ICD transmission.   

## 2020-09-26 ENCOUNTER — Other Ambulatory Visit: Payer: Self-pay | Admitting: Cardiovascular Disease

## 2020-10-02 ENCOUNTER — Other Ambulatory Visit: Payer: Self-pay | Admitting: Cardiovascular Disease

## 2020-10-02 NOTE — Telephone Encounter (Signed)
Rx request sent to pharmacy.  

## 2020-10-21 ENCOUNTER — Other Ambulatory Visit: Payer: Self-pay | Admitting: Cardiovascular Disease

## 2020-11-11 ENCOUNTER — Other Ambulatory Visit: Payer: Self-pay | Admitting: Cardiovascular Disease

## 2020-11-12 NOTE — Telephone Encounter (Signed)
Rx request sent to pharmacy.  

## 2020-12-04 ENCOUNTER — Ambulatory Visit (INDEPENDENT_AMBULATORY_CARE_PROVIDER_SITE_OTHER): Payer: 59

## 2020-12-04 DIAGNOSIS — I255 Ischemic cardiomyopathy: Secondary | ICD-10-CM | POA: Diagnosis not present

## 2020-12-05 LAB — CUP PACEART REMOTE DEVICE CHECK
Battery Remaining Longevity: 30 mo
Battery Remaining Percentage: 25 %
Battery Voltage: 2.81 V
Brady Statistic RV Percent Paced: 1 %
Date Time Interrogation Session: 20220105050653
HighPow Impedance: 50 Ohm
Implantable Lead Implant Date: 20120907
Implantable Lead Location: 753860
Implantable Pulse Generator Implant Date: 20120907
Lead Channel Impedance Value: 450 Ohm
Lead Channel Pacing Threshold Amplitude: 1 V
Lead Channel Pacing Threshold Pulse Width: 0.5 ms
Lead Channel Sensing Intrinsic Amplitude: 12 mV
Lead Channel Setting Pacing Amplitude: 2.5 V
Lead Channel Setting Pacing Pulse Width: 0.5 ms
Lead Channel Setting Sensing Sensitivity: 0.5 mV
Pulse Gen Serial Number: 1027081

## 2020-12-18 NOTE — Progress Notes (Signed)
Remote ICD transmission.   

## 2021-02-21 ENCOUNTER — Ambulatory Visit: Payer: 59 | Admitting: Cardiovascular Disease

## 2021-02-26 ENCOUNTER — Ambulatory Visit: Payer: 59 | Admitting: Internal Medicine

## 2021-02-26 ENCOUNTER — Other Ambulatory Visit: Payer: Self-pay

## 2021-02-26 ENCOUNTER — Encounter: Payer: Self-pay | Admitting: Internal Medicine

## 2021-02-26 VITALS — BP 100/70 | HR 66 | Ht 72.0 in | Wt 253.5 lb

## 2021-02-26 DIAGNOSIS — I5022 Chronic systolic (congestive) heart failure: Secondary | ICD-10-CM

## 2021-02-26 DIAGNOSIS — E782 Mixed hyperlipidemia: Secondary | ICD-10-CM | POA: Diagnosis not present

## 2021-02-26 DIAGNOSIS — I255 Ischemic cardiomyopathy: Secondary | ICD-10-CM | POA: Diagnosis not present

## 2021-02-26 NOTE — Patient Instructions (Signed)

## 2021-02-26 NOTE — Progress Notes (Signed)
Patient Care Team: Pcp, No as PCP - General Roy Ouch, MD as PCP - Cardiology (Cardiology)   HPI  Roy Turner is a 50 y.o. male Seen in follow-up of an St Jude  ICD implanted 2010 for secondary prevention.  He has a history of cardiac arrest in the context of cardiogenic shock and acute LAD and RCA occlusion. Efforts at percutaneous intervention will complicated by dissection and he underwent bypass surgery followed 2 days later by the VF arrest. Repeat catheterization demonstrated no clear triggering event and he underwent ICD implantation.  Last seen 2018   Dr. Ranae Turner note 9/21 notes that his 37 year old son was diagnosed with ewings sarcoma with metastases. He has just finished 10 -11 months of chemo and radiation for most of the tumor for small nodules in his lung  The patient has been daily involved and is gradually getting back to work weight has increased.  Dyspnea with exertion.  No edema.  No chest pain.  No nocturnal dyspnea orthopnea  DATE TEST EF   4/17 Echo   25-30 %   1/19 LHC   LIMA-LAD p; SVG-OM2 -p SVG-RPDA Total Severe native 3VCAD           Past Medical History:  Diagnosis Date  . CAD (coronary artery disease)    a. 07/2011 Anterior STEMI w/ CGS/Cath: LAD 100, LCX 80, RCA 100-->PCI aborted 2/2 dissection-->CABG x 3 (VG->LAD, Free LIMA->OM2, VG->RCA); b. 07/30/2011 Emergent Cath 2/2 VF arrest: patent grafts w/ CAD distal to graft touchdown; c. 03/2014 Neg MV, EF 29%; d. 12/2017 Cath: LM nl, LAD 90ost/p, 100p/m, D1 90, LCX 60, RCA 80p, 78m, 100d ISR. VG->LAD ok, FLIMA->OM2 ok, VG->RCA 100 w/ L->R collats->Med Rx  . H/O cardiac arrest    a. 07/2011 VF arrest 2 days following CABG.  . HFrEF (heart failure with reduced ejection fraction) (HCC)    a. 03/2016 Echo: EF 25-30%, antsept, ant, antlat, apical AK. Mildly dil LA.  Marland Kitchen Hyperlipidemia   . Ischemic cardiomyopathy    a. 08/2011 s/p SJM 1241-40Q Fortify ST VR single lead AICD; b. 03/2016 Echo: EF 25-30%.     Past Surgical History:  Procedure Laterality Date  . CARDIAC CATHETERIZATION     [place]  . CARDIAC DEFIBRILLATOR PLACEMENT  08/08/11   SJM Fortify ST VR implanted by Dr Roy Turner at [facility]  . CORONARY ARTERY BYPASS GRAFT  07/28/11   LIMA to OM, SVG to LAD, SVGF to PDA  . LEFT HEART CATH AND CORS/GRAFTS ANGIOGRAPHY Left 12/14/2017   Procedure: LEFT HEART CATH AND CORS/GRAFTS ANGIOGRAPHY;  Surgeon: Roy Ouch, MD;  Location: [facility] INVASIVE CV LAB;  Service: Cardiovascular;  Laterality: Left;    Current Outpatient Medications  Medication Sig Dispense Refill  . aspirin 81 MG tablet Take 81 mg by mouth daily.    . carvedilol (COREG) 6.25 MG tablet TAKE 1 TABLET BY MOUTH TWICE A DAY 180 tablet 0  . cyclobenzaprine (FLEXERIL) 10 MG tablet Take 1 tablet (10 mg total) by mouth 3 (three) times daily as needed for muscle spasms. 90 tablet 2  . ENTRESTO 24-26 MG TAKE 1 TABLET BY MOUTH TWICE A DAY 60 tablet 5  . eplerenone (INSPRA) 25 MG tablet TAKE 1 TABLET BY MOUTH EVERY DAY 90 tablet 1  . ezetimibe (ZETIA) 10 MG tablet TAKE 1 TABLET BY MOUTH EVERY DAY 90 tablet 1  . KLOR-CON M20 20 MEQ tablet TAKE 1 TABLET BY MOUTH EVERY DAY 90 tablet  0  . magnesium oxide (MAG-OX) 400 MG tablet Take 1 tablet (400 mg total) by mouth 2 (two) times daily. 6 tablet 0  . Multiple Vitamin (MULTIVITAMIN) capsule Take 1 capsule by mouth daily.    . nitroGLYCERIN (NITROSTAT) 0.4 MG SL tablet Place 1 tablet (0.4 mg total) under the tongue every 5 (five) minutes as needed for chest pain. 25 tablet 3  . Omega-3 Fatty Acids ([employer]S FISH OIL) 1000 MG CAPS Take 3,000 mg by mouth daily. 90 capsule   . pantoprazole (PROTONIX) 40 MG tablet TAKE 1 TABLET BY MOUTH EVERY DAY 90 tablet 0  . rosuvastatin (CRESTOR) 40 MG tablet TAKE 1 TABLET BY MOUTH EVERY DAY 90 tablet 1  . torsemide (DEMADEX) 20 MG tablet TAKE TWO TABLETS (40MG ) DAILY IN THE MORNING AND ONE TABLET (20MG ) IN THE AFTERNOON AS-NEEDED. 90 tablet 5    No current facility-administered medications for this visit.    Allergies  Allergen Reactions  . Penicillins Hives    As a child      Review of Systems negative except from HPI and PMH  Physical Exam BP 100/70 (BP Location: Left Arm, Patient Position: Sitting, Cuff Size: Normal)   Pulse 66   Ht 6' (1.829 m)   Wt 253 lb 8 oz (115 kg)   SpO2 97%   BMI 34.38 kg/m  Well developed and well nourished in no acute distress HENT normal Neck supple with JVP-flat Clear Device pocket well healed; without hematoma or erythema.  There is no tethering  Regular rate and rhythm, no  murmur Abd-soft with active BS No Clubbing cyanosis tr edema Skin-warm and dry A & Oriented  Grossly normal sensory and motor function  ECG sinus at 66 Intervals 19/10/39 Anteroseptal infarct Possible inferior wall infarct  Assessment and  Plan  Ischemic cardiomyopathy  Morbid obesity  Congestive heart failure-chronic-systolic   Implantable defibrillator St. Jude  The patient's device was interrogated.  The information was reviewed. No changes were made in the programming.      Without symptoms of ischemia.  Functional status is limited but he has been sedentary and has put on a bunch of weight in the last year concurrent with his son's battle against sarcoma  Mildly volume overload.  Have encouraged decrease sodium intake

## 2021-02-27 ENCOUNTER — Other Ambulatory Visit: Payer: Self-pay | Admitting: Cardiovascular Disease

## 2021-02-27 DIAGNOSIS — I5022 Chronic systolic (congestive) heart failure: Secondary | ICD-10-CM

## 2021-02-28 ENCOUNTER — Ambulatory Visit: Payer: 59 | Admitting: Cardiovascular Disease

## 2021-02-28 ENCOUNTER — Other Ambulatory Visit: Payer: Self-pay

## 2021-02-28 ENCOUNTER — Encounter: Payer: Self-pay | Admitting: Cardiovascular Disease

## 2021-02-28 VITALS — BP 100/76 | HR 75 | Ht 72.0 in | Wt 254.0 lb

## 2021-02-28 DIAGNOSIS — I25118 Atherosclerotic heart disease of native coronary artery with other forms of angina pectoris: Secondary | ICD-10-CM

## 2021-02-28 DIAGNOSIS — I5022 Chronic systolic (congestive) heart failure: Secondary | ICD-10-CM | POA: Diagnosis not present

## 2021-02-28 DIAGNOSIS — E785 Hyperlipidemia, unspecified: Secondary | ICD-10-CM | POA: Diagnosis not present

## 2021-02-28 NOTE — Patient Instructions (Signed)

## 2021-02-28 NOTE — Progress Notes (Signed)
Cardiology Office Note   Date:  02/28/2021   ID:  Roy Turner, DOB Jul 29, 1971, MRN [id number]  PCP:  Pcp, No  Cardiologist:   [name], MD   Chief Complaint  Patient presents with  . Follow-up    6 month F/U      History of Present Illness: Roy Turner is a 50 y.o. male who presents for a followup visit regarding coronary artery disease and chronic systolic heart failure.  He has known h/o CAD s/p anterior STEMI with cardiogenic shock in 2012. cardiac catheterization showed 100% LAD occlusion, 100% RCA occlusion, and 80% LCx stenosis.  Intervention was performed but per report, this was aborted due to dissection.  He underwent CABG on 07/28/11.  2 days later, he had a VF arrest.  Emergent cath revealed patent CABG grafts.  He underwent implantation of a SJM Fortify ST VR ICD 08/08/11.  He has done reasonably well since that time.    He had a myoview 4/15 which revealed EF 29%.   Most recent echocardiogram in April 2017 showed an ejection fraction of 25-30% with akinesis of the anteroseptal, anterior, anterolateral and apical myocardium. Cardiac catheterization  in January, 2019 showed significant underlying three-vessel coronary artery disease with patent LIMA to OM 2 and SVG to LAD.  SVG to RCA was occluded with chronically occluded native right coronary artery with right to right and left to right collaterals.  LVEDP was normal.  He has been doing reasonably well with minimal chest pain that does not require nitroglycerin.  He reports stable exertional dyspnea.  His 81 year old son recently finished extensive treatment for bone sarcoma with chemotherapy and radiation.  The patient has been less stressed since then.  He is planning to resume his exercise and physical activities as before.   Past Medical History:  Diagnosis Date  . CAD (coronary artery disease)    a. 07/2011 Anterior STEMI w/ CGS/Cath: LAD 100, LCX 80, RCA 100-->PCI aborted 2/2 dissection-->CABG x 3 (VG->LAD, Free  LIMA->OM2, VG->RCA); b. 07/30/2011 Emergent Cath 2/2 VF arrest: patent grafts w/ CAD distal to graft touchdown; c. 03/2014 Neg MV, EF 29%; d. 12/2017 Cath: LM nl, LAD 90ost/p, 100p/m, D1 90, LCX 60, RCA 80p, 52m, 100d ISR. VG->LAD ok, FLIMA->OM2 ok, VG->RCA 100 w/ L->R collats->Med Rx  . H/O cardiac arrest    a. 07/2011 VF arrest 2 days following CABG.  . HFrEF (heart failure with reduced ejection fraction) (HCC)    a. 03/2016 Echo: EF 25-30%, antsept, ant, antlat, apical AK. Mildly dil LA.  Marland Kitchen Hyperlipidemia   . Ischemic cardiomyopathy    a. 08/2011 s/p SJM 1241-40Q Fortify ST VR single lead AICD; b. 03/2016 Echo: EF 25-30%.    Past Surgical History:  Procedure Laterality Date  . CARDIAC CATHETERIZATION     [place]  . CARDIAC DEFIBRILLATOR PLACEMENT  08/08/11   SJM Fortify ST VR implanted by Dr Selena Batten at [facility]  . CORONARY ARTERY BYPASS GRAFT  07/28/11   LIMA to OM, SVG to LAD, SVGF to PDA  . LEFT HEART CATH AND CORS/GRAFTS ANGIOGRAPHY Left 12/14/2017   Procedure: LEFT HEART CATH AND CORS/GRAFTS ANGIOGRAPHY;  Surgeon: [name], MD;  Location: [facility] INVASIVE CV LAB;  Service: Cardiovascular;  Laterality: Left;     Current Outpatient Medications  Medication Sig Dispense Refill  . aspirin 81 MG tablet Take 81 mg by mouth daily.    . carvedilol (COREG) 6.25 MG tablet TAKE 1 TABLET BY MOUTH TWICE A DAY  180 tablet 0  . cyclobenzaprine (FLEXERIL) 10 MG tablet Take 1 tablet (10 mg total) by mouth 3 (three) times daily as needed for muscle spasms. 90 tablet 2  . ENTRESTO 24-26 MG TAKE 1 TABLET BY MOUTH TWICE A DAY 60 tablet 5  . eplerenone (INSPRA) 25 MG tablet TAKE 1 TABLET BY MOUTH EVERY DAY 90 tablet 0  . ezetimibe (ZETIA) 10 MG tablet TAKE 1 TABLET BY MOUTH EVERY DAY 90 tablet 1  . KLOR-CON M20 20 MEQ tablet TAKE 1 TABLET BY MOUTH EVERY DAY 90 tablet 0  . magnesium oxide (MAG-OX) 400 MG tablet Take 1 tablet (400 mg total) by mouth 2 (two) times daily. 6 tablet 0  .  Multiple Vitamin (MULTIVITAMIN) capsule Take 1 capsule by mouth daily.    . nitroGLYCERIN (NITROSTAT) 0.4 MG SL tablet Place 1 tablet (0.4 mg total) under the tongue every 5 (five) minutes as needed for chest pain. 25 tablet 3  . Omega-3 Fatty Acids ([employer]S FISH OIL) 1000 MG CAPS Take 3,000 mg by mouth daily. 90 capsule   . pantoprazole (PROTONIX) 40 MG tablet TAKE 1 TABLET BY MOUTH EVERY DAY 90 tablet 0  . rosuvastatin (CRESTOR) 40 MG tablet TAKE 1 TABLET BY MOUTH EVERY DAY 90 tablet 1  . torsemide (DEMADEX) 20 MG tablet TAKE TWO TABLETS (40MG ) DAILY IN THE MORNING AND ONE TABLET (20MG ) IN THE AFTERNOON AS-NEEDED. 90 tablet 5   No current facility-administered medications for this visit.    Allergies:   Penicillins    Social History:  The patient  reports that he quit smoking about 9 years ago. His smoking use included cigarettes. He has a 15.00 pack-year smoking history. He has never used smokeless tobacco. He reports previous alcohol use. He reports that he does not use drugs.   Family History:  The patient's family history includes Cancer in his father.    ROS:  Please see the history of present illness.   Otherwise, review of systems are positive for none.   All other systems are reviewed and negative.    PHYSICAL EXAM: VS:  BP 100/76 (BP Location: Left Arm, Patient Position: Sitting, Cuff Size: Large)   Pulse 75   Ht 6' (1.829 m)   Wt 254 lb (115.2 kg)   SpO2 98%   BMI 34.45 kg/m  , BMI Body mass index is 34.45 kg/m. GEN: Well nourished, well developed, in no acute distress  HEENT: normal  Neck: Mild JVD, carotid bruits, or masses Cardiac: RRR; no murmurs, rubs, or gallops.  trace edema bilaterally.  Respiratory:  clear to auscultation bilaterally, normal work of breathing GI: soft, nontender+ BS.  Mildly distended likely with some ascites. MS: no deformity or atrophy  Skin: warm and dry, no rash Neuro:  Strength and sensation are intact Psych: euthymic mood, full  affect   EKG:  EKG is not  ordered today.    Recent Labs: 08/16/2020: ALT 36; BUN 14; Creatinine, Ser 1.20; Potassium 3.8; Sodium 141    Lipid Panel    Component Value Date/Time   CHOL 134 08/16/2020 1553   TRIG 288 (H) 08/16/2020 1553   HDL 39 (L) 08/16/2020 1553   CHOLHDL 3.4 08/16/2020 1553   CHOLHDL 3.3 09/06/2019 1016   VLDL 42 (H) 09/06/2019 1016   LDLCALC 50 08/16/2020 1553      Wt Readings from Last 3 Encounters:  02/28/21 254 lb (115.2 kg)  02/26/21 253 lb 8 oz (115 kg)  08/16/20 251 lb 8 oz (  114.1 kg)         ASSESSMENT AND PLAN:  1. Coronary artery disease involving native coronary arteries with stable angina: Symptoms are well controlled with medications.  He has chronically occluded RCA with collaterals.  Continue medical therapy.    2.  Chronic systolic heart failure: He appears to be euvolemic on current dose of torsemide.  He is on optimal medical therapy and currently [place] heart association class II. Labs 6 months ago showed stable renal function and electrolytes.  3. Hyperlipidemia: Continue treatment with high-dose rosuvastatin and Zetia.  Most recent lipid profile showed an LDL of 50 and triglyceride of 288.  He has chronically elevated triglyceride in spite of taking fish oil.  I encouraged him to improve his diet and resume his exercise program.  I will plan on repeat labs in 6 months with his visit.  If triglyceride remains elevated, we could consider Vascepa.   Disposition:   FU with me in 6 months.  Signed,  [name], MD  02/28/2021 11:11 AM    [facility]

## 2021-03-03 ENCOUNTER — Other Ambulatory Visit: Payer: Self-pay | Admitting: Family

## 2021-03-03 ENCOUNTER — Other Ambulatory Visit: Payer: Self-pay | Admitting: Cardiovascular Disease

## 2021-03-03 DIAGNOSIS — I5022 Chronic systolic (congestive) heart failure: Secondary | ICD-10-CM

## 2021-03-04 NOTE — Telephone Encounter (Signed)
Rx request sent to pharmacy.  

## 2021-03-05 LAB — CUP PACEART REMOTE DEVICE CHECK
Battery Remaining Longevity: 28 mo
Battery Remaining Percentage: 24 %
Battery Voltage: 2.8 V
Brady Statistic RV Percent Paced: 0 %
Date Time Interrogation Session: 20220405040913
HighPow Impedance: 45 Ohm
Implantable Lead Implant Date: 20120907
Implantable Lead Location: 753860
Implantable Pulse Generator Implant Date: 20120907
Lead Channel Impedance Value: 440 Ohm
Lead Channel Pacing Threshold Amplitude: 0.75 V
Lead Channel Pacing Threshold Pulse Width: 0.5 ms
Lead Channel Sensing Intrinsic Amplitude: 12 mV
Lead Channel Setting Pacing Amplitude: 2.5 V
Lead Channel Setting Pacing Pulse Width: 0.5 ms
Lead Channel Setting Sensing Sensitivity: 0.5 mV
Pulse Gen Serial Number: 1027081

## 2021-05-20 ENCOUNTER — Other Ambulatory Visit: Payer: Self-pay | Admitting: *Deleted

## 2021-05-20 MED ORDER — POTASSIUM CHLORIDE CRYS ER 20 MEQ PO TBCR: 20.0000 meq | EXTENDED_RELEASE_TABLET | Freq: Every day | ORAL | 0 refills | Status: DC

## 2021-05-28 ENCOUNTER — Other Ambulatory Visit: Payer: Self-pay | Admitting: Cardiovascular Disease

## 2021-05-31 ENCOUNTER — Other Ambulatory Visit: Payer: Self-pay | Admitting: Cardiovascular Disease

## 2021-05-31 ENCOUNTER — Other Ambulatory Visit: Payer: Self-pay

## 2021-05-31 MED ORDER — CARVEDILOL 6.25 MG PO TABS: 6.2500 mg | ORAL_TABLET | Freq: Two times a day (BID) | ORAL | 0 refills | Status: DC

## 2021-06-04 ENCOUNTER — Ambulatory Visit (INDEPENDENT_AMBULATORY_CARE_PROVIDER_SITE_OTHER): Payer: BLUE CROSS/BLUE SHIELD

## 2021-06-04 DIAGNOSIS — I255 Ischemic cardiomyopathy: Secondary | ICD-10-CM | POA: Diagnosis not present

## 2021-06-04 LAB — CUP PACEART REMOTE DEVICE CHECK
Battery Remaining Longevity: 24 mo
Battery Remaining Percentage: 20 %
Battery Voltage: 2.77 V
Brady Statistic RV Percent Paced: 1 %
Date Time Interrogation Session: 20220705023907
HighPow Impedance: 46 Ohm
Implantable Lead Implant Date: 20120907
Implantable Lead Location: 753860
Implantable Pulse Generator Implant Date: 20120907
Lead Channel Impedance Value: 440 Ohm
Lead Channel Pacing Threshold Amplitude: 0.75 V
Lead Channel Pacing Threshold Pulse Width: 0.5 ms
Lead Channel Sensing Intrinsic Amplitude: 12 mV
Lead Channel Setting Pacing Amplitude: 2.5 V
Lead Channel Setting Pacing Pulse Width: 0.5 ms
Lead Channel Setting Sensing Sensitivity: 0.5 mV
Pulse Gen Serial Number: 1027081

## 2021-06-24 NOTE — Progress Notes (Signed)
Remote ICD transmission.   

## 2021-07-20 ENCOUNTER — Other Ambulatory Visit: Payer: Self-pay | Admitting: Cardiovascular Disease

## 2021-08-03 ENCOUNTER — Other Ambulatory Visit: Payer: Self-pay | Admitting: Cardiovascular Disease

## 2021-08-29 ENCOUNTER — Ambulatory Visit: Payer: BLUE CROSS/BLUE SHIELD | Admitting: Cardiovascular Disease

## 2021-08-29 ENCOUNTER — Other Ambulatory Visit: Payer: Self-pay

## 2021-08-29 ENCOUNTER — Encounter: Payer: Self-pay | Admitting: Cardiovascular Disease

## 2021-08-29 VITALS — BP 102/70 | HR 61 | Ht 72.0 in | Wt 245.5 lb

## 2021-08-29 DIAGNOSIS — I5022 Chronic systolic (congestive) heart failure: Secondary | ICD-10-CM

## 2021-08-29 DIAGNOSIS — I2510 Atherosclerotic heart disease of native coronary artery without angina pectoris: Secondary | ICD-10-CM

## 2021-08-29 DIAGNOSIS — E782 Mixed hyperlipidemia: Secondary | ICD-10-CM

## 2021-08-29 MED ORDER — NITROGLYCERIN 0.4 MG SL SUBL: 0.4000 mg | SUBLINGUAL_TABLET | SUBLINGUAL | 1 refills | Status: DC | PRN

## 2021-08-29 NOTE — Progress Notes (Signed)
Cardiology Office Note   Date:  08/29/2021   ID:  [name], DOB 16-Apr-1971, MRN [id number]  PCP:  Pcp, No  Cardiologist:   [name], MD   Chief Complaint  Patient presents with   Other    6 month f/u no complaints today. Meds reviewed verbally with pt.       History of Present Illness: Roy Turner is a 50 y.o. male who presents for a followup visit regarding coronary artery disease and chronic systolic heart failure.  He has known h/o CAD s/p anterior STEMI with cardiogenic shock in 2012. cardiac catheterization showed 100% LAD occlusion, 100% RCA occlusion, and 80% LCx stenosis.  Intervention was performed but per report, this was aborted due to dissection.  He underwent CABG on 07/28/11.  2 days later, he had a VF arrest.  Emergent cath revealed patent CABG grafts.  He underwent implantation of a SJM Fortify ST VR ICD 08/08/11.   Most recent echocardiogram in April 2017 showed an ejection fraction of 25-30% with akinesis of the anteroseptal, anterior, anterolateral and apical myocardium. Cardiac catheterization  in January, 2019 showed significant underlying three-vessel coronary artery disease with patent LIMA to OM 2 and SVG to LAD.  SVG to RCA was occluded with chronically occluded native right coronary artery with right to right and left to right collaterals.  LVEDP was normal.   His 57 year old son recently finished extensive treatment for bone sarcoma with chemotherapy and radiation.  He had a successful procedure to replace lost bone recently.  The patient is feeling better overall given that he is less stressed.  He denies chest pain, shortness of breath or palpitations.   Past Medical History:  Diagnosis Date   CAD (coronary artery disease)    a. 07/2011 Anterior STEMI w/ CGS/Cath: LAD 100, LCX 80, RCA 100-->PCI aborted 2/2 dissection-->CABG x 3 (VG->LAD, Free LIMA->OM2, VG->RCA); b. 07/30/2011 Emergent Cath 2/2 VF arrest: patent grafts w/ CAD distal to graft  touchdown; c. 03/2014 Neg MV, EF 29%; d. 12/2017 Cath: LM nl, LAD 90ost/p, 100p/m, D1 90, LCX 60, RCA 80p, 58m, 100d ISR. VG->LAD ok, FLIMA->OM2 ok, VG->RCA 100 w/ L->R collats->Med Rx   H/O cardiac arrest    a. 07/2011 VF arrest 2 days following CABG.   HFrEF (heart failure with reduced ejection fraction) (HCC)    a. 03/2016 Echo: EF 25-30%, antsept, ant, antlat, apical AK. Mildly dil LA.   Hyperlipidemia    Ischemic cardiomyopathy    a. 08/2011 s/p SJM [id number] Fortify ST VR single lead AICD; b. 03/2016 Echo: EF 25-30%.    Past Surgical History:  Procedure Laterality Date   CARDIAC CATHETERIZATION     [place]   CARDIAC DEFIBRILLATOR PLACEMENT  08/08/11   SJM Fortify ST VR implanted by Dr Selena Batten at [facility]   CORONARY ARTERY BYPASS GRAFT  07/28/11   LIMA to OM, SVG to LAD, SVGF to PDA   LEFT HEART CATH AND CORS/GRAFTS ANGIOGRAPHY Left 12/14/2017   Procedure: LEFT HEART CATH AND CORS/GRAFTS ANGIOGRAPHY;  Surgeon: [name], MD;  Location: [facility] INVASIVE CV LAB;  Service: Cardiovascular;  Laterality: Left;     Current Outpatient Medications  Medication Sig Dispense Refill   aspirin 81 MG tablet Take 81 mg by mouth daily.     carvedilol (COREG) 6.25 MG tablet TAKE 1 TABLET BY MOUTH TWICE A DAY 180 tablet 0   ENTRESTO 24-26 MG TAKE 1 TABLET BY MOUTH TWICE A DAY 60 tablet 5  eplerenone (INSPRA) 25 MG tablet TAKE 1 TABLET BY MOUTH EVERY DAY 90 tablet 1   ezetimibe (ZETIA) 10 MG tablet TAKE 1 TABLET BY MOUTH EVERY DAY 90 tablet 1   KLOR-CON M20 20 MEQ tablet TAKE 1 TABLET BY MOUTH EVERY DAY 90 tablet 0   magnesium oxide (MAG-OX) 400 MG tablet Take 1 tablet (400 mg total) by mouth 2 (two) times daily. 6 tablet 0   Multiple Vitamin (MULTIVITAMIN) capsule Take 1 capsule by mouth daily.     nitroGLYCERIN (NITROSTAT) 0.4 MG SL tablet Place 1 tablet (0.4 mg total) under the tongue every 5 (five) minutes as needed for chest pain. 25 tablet 3   Omega-3 Fatty Acids ([employer] FISH OIL)  1000 MG CAPS Take 3,000 mg by mouth daily. 90 capsule    pantoprazole (PROTONIX) 40 MG tablet TAKE 1 TABLET BY MOUTH EVERY DAY 90 tablet 1   rosuvastatin (CRESTOR) 40 MG tablet TAKE 1 TABLET BY MOUTH EVERY DAY 90 tablet 0   torsemide (DEMADEX) 20 MG tablet TAKE TWO TABLETS (40MG ) DAILY IN THE MORNING AND ONE TABLET (20MG ) IN THE AFTERNOON AS-NEEDED. 270 tablet 0   cyclobenzaprine (FLEXERIL) 10 MG tablet Take 1 tablet (10 mg total) by mouth 3 (three) times daily as needed for muscle spasms. (Patient not taking: Reported on 08/29/2021) 90 tablet 2   No current facility-administered medications for this visit.    Allergies:   Penicillins    Social History:  The patient  reports that he quit smoking about 10 years ago. His smoking use included cigarettes. He has a 15.00 pack-year smoking history. He has never used smokeless tobacco. He reports that he does not currently use alcohol. He reports that he does not use drugs.   Family History:  The patient's family history includes Cancer in his father.    ROS:  Please see the history of present illness.   Otherwise, review of systems are positive for none.   All other systems are reviewed and negative.    PHYSICAL EXAM: VS:  BP 102/70 (BP Location: Left Arm, Patient Position: Sitting, Cuff Size: Large)   Pulse 61   Ht 6' (1.829 m)   Wt 245 lb 8 oz (111.4 kg)   BMI 33.30 kg/m  , BMI Body mass index is 33.3 kg/m. GEN: Well nourished, well developed, in no acute distress  HEENT: normal  Neck: Mild JVD, carotid bruits, or masses Cardiac: RRR; no murmurs, rubs, or gallops.  trace edema bilaterally.  Respiratory:  clear to auscultation bilaterally, normal work of breathing GI: soft, nontender+ BS.  Mildly distended likely with some ascites. MS: no deformity or atrophy  Skin: warm and dry, no rash Neuro:  Strength and sensation are intact Psych: euthymic mood, full affect   EKG:  EKG is  ordered today. EKG showed normal sinus rhythm with  prior anteroseptal infarct.   Recent Labs: No results found for requested labs within last 8760 hours.    Lipid Panel    Component Value Date/Time   CHOL 134 08/16/2020 1553   TRIG 288 (H) 08/16/2020 1553   HDL 39 (L) 08/16/2020 1553   CHOLHDL 3.4 08/16/2020 1553   CHOLHDL 3.3 09/06/2019 1016   VLDL 42 (H) 09/06/2019 1016   LDLCALC 50 08/16/2020 1553      Wt Readings from Last 3 Encounters:  08/29/21 245 lb 8 oz (111.4 kg)  02/28/21 254 lb (115.2 kg)  02/26/21 253 lb 8 oz (115 kg)  ASSESSMENT AND PLAN:  1. Coronary artery disease involving native coronary arteries with stable angina: Symptoms are well controlled with medications.  He has chronically occluded RCA with collaterals.  Continue medical therapy.    2.  Chronic systolic heart failure: He appears to be euvolemic on current dose of torsemide.  He is on optimal medical therapy and currently [place] heart association class II. I requested basic metabolic profile.  3. Hyperlipidemia: Continue treatment with high-dose rosuvastatin and Zetia.  Most recent lipid profile showed an LDL of 50 and triglyceride of 288.  I requested fasting lipid and liver profile to be done today.  If triglyceride remains elevated, will consider adding Vascepa.   Disposition:   FU with me in 6 months.  Signed,   [name], MD  08/29/2021 11:21 AM    [facility]

## 2021-08-29 NOTE — Patient Instructions (Signed)
Medication Instructions:  Your physician recommends that you continue on your current medications as directed. Please refer to the Current Medication list given to you today.  *If you need a refill on your cardiac medications before your next appointment, please call your pharmacy*   Lab Work: Cbc, Cmp, Lipid If you have labs (blood work) drawn today and your tests are completely normal, you will receive your results only by: MyChart Message (if you have MyChart) OR A paper copy in the mail If you have any lab test that is abnormal or we need to change your treatment, we will call you to review the results.   Testing/Procedures: None ordered   Follow-Up: At [facility], you and your health needs are our priority.  As part of our continuing mission to provide you with exceptional heart care, we have created designated Provider Care Teams.  These Care Teams include your primary Cardiologist (physician) and Advanced Practice Providers (APPs -  Physician Assistants and Nurse Practitioners) who all work together to provide you with the care you need, when you need it.  We recommend signing up for the patient portal called "MyChart".  Sign up information is provided on this After Visit Summary.  MyChart is used to connect with patients for Virtual Visits (Telemedicine).  Patients are able to view lab/test results, encounter notes, upcoming appointments, etc.  Non-urgent messages can be sent to your provider as well.   To learn more about what you can do with MyChart, go to [email or web address].    Your next appointment:   6 month(s)  The format for your next appointment:   In Person  Provider:   You may see [name], MD or one of the following Advanced Practice Providers on your designated Care Team:   [name], NP [name], PA-C Marisue Ivan, PA-C [name], [place]   Other Instructions N/A

## 2021-08-30 ENCOUNTER — Telehealth: Payer: Self-pay

## 2021-08-30 LAB — LIPID PANEL
Chol/HDL Ratio: 3.4 ratio (ref 0.0–5.0)
Cholesterol, Total: 126 mg/dL (ref 100–199)
HDL: 37 mg/dL — ABNORMAL LOW (ref >39)
LDL Chol Calc (NIH): 48 mg/dL (ref 0–99)
Triglycerides: 261 mg/dL — ABNORMAL HIGH (ref 0–149)
VLDL Cholesterol Cal: 41 mg/dL — ABNORMAL HIGH (ref 5–40)

## 2021-08-30 LAB — CBC WITH DIFFERENTIAL/PLATELET
Basophils Absolute: 0.1 10*3/uL (ref 0.0–0.2)
Basos: 1 %
EOS (ABSOLUTE): 0.1 10*3/uL (ref 0.0–0.4)
Eos: 2 %
Hematocrit: 42.8 % (ref 37.5–51.0)
Hemoglobin: 14.7 g/dL (ref 13.0–17.7)
Immature Grans (Abs): 0 10*3/uL (ref 0.0–0.1)
Immature Granulocytes: 0 %
Lymphocytes Absolute: 2.3 10*3/uL (ref 0.7–3.1)
Lymphs: 35 %
MCH: 30.5 pg (ref 26.6–33.0)
MCHC: 34.3 g/dL (ref 31.5–35.7)
MCV: 89 fL (ref 79–97)
Monocytes Absolute: 0.4 10*3/uL (ref 0.1–0.9)
Monocytes: 5 %
Neutrophils Absolute: 3.8 10*3/uL (ref 1.4–7.0)
Neutrophils: 57 %
Platelets: 202 10*3/uL (ref 150–450)
RBC: 4.82 x10E6/uL (ref 4.14–5.80)
RDW: 12.7 % (ref 11.6–15.4)
WBC: 6.7 10*3/uL (ref 3.4–10.8)

## 2021-08-30 LAB — COMPREHENSIVE METABOLIC PANEL
ALT: 26 IU/L (ref 0–44)
AST: 25 IU/L (ref 0–40)
Albumin/Globulin Ratio: 2.5 — ABNORMAL HIGH (ref 1.2–2.2)
Albumin: 5.2 g/dL — ABNORMAL HIGH (ref 4.0–5.0)
Alkaline Phosphatase: 79 IU/L (ref 44–121)
BUN/Creatinine Ratio: 13 (ref 9–20)
BUN: 14 mg/dL (ref 6–24)
Bilirubin Total: 0.7 mg/dL (ref 0.0–1.2)
CO2: 25 mmol/L (ref 20–29)
Calcium: 9.7 mg/dL (ref 8.7–10.2)
Chloride: 100 mmol/L (ref 96–106)
Creatinine, Ser: 1.04 mg/dL (ref 0.76–1.27)
Globulin, Total: 2.1 g/dL (ref 1.5–4.5)
Glucose: 113 mg/dL — ABNORMAL HIGH (ref 70–99)
Potassium: 4.2 mmol/L (ref 3.5–5.2)
Sodium: 141 mmol/L (ref 134–144)
Total Protein: 7.3 g/dL (ref 6.0–8.5)
eGFR: 88 mL/min/{1.73_m2} (ref >59)

## 2021-08-30 MED ORDER — ICOSAPENT ETHYL 1 G PO CAPS: 2.0000 g | ORAL_CAPSULE | Freq: Two times a day (BID) | ORAL | 5 refills | Status: DC

## 2021-08-30 MED ORDER — PANTOPRAZOLE SODIUM 40 MG PO TBEC: 40.0000 mg | DELAYED_RELEASE_TABLET | Freq: Every day | ORAL | 1 refills | Status: DC

## 2021-08-30 NOTE — Telephone Encounter (Signed)
Patient made aware of lab results and Dr. Jari Sportsman recommendation with verbalized understading. Patient is agreeable with starting Vascepa 2g bid as recommended. Rx has been sent to the patients pharmacy.

## 2021-08-30 NOTE — Telephone Encounter (Signed)
-----   Message from [name], MD sent at 08/30/2021  9:06 AM EDT ----- Inform patient that labs were normal. Cholesterol was excellent but triglyceride continues to be moderately elevated.  Recommend adding Vascepa 2 g twice daily.  We can plan on repeat fasting lipid profile during his next follow-up visit in 6 months.

## 2021-09-02 NOTE — Addendum Note (Signed)
Addended by: Kendrick Fries on: 09/02/2021 07:40 AM   Modules accepted: Orders

## 2021-09-03 ENCOUNTER — Ambulatory Visit (INDEPENDENT_AMBULATORY_CARE_PROVIDER_SITE_OTHER): Payer: BLUE CROSS/BLUE SHIELD

## 2021-09-03 DIAGNOSIS — I255 Ischemic cardiomyopathy: Secondary | ICD-10-CM | POA: Diagnosis not present

## 2021-09-03 LAB — CUP PACEART REMOTE DEVICE CHECK
Battery Remaining Longevity: 24 mo
Battery Remaining Percentage: 20 %
Battery Voltage: 2.77 V
Brady Statistic RV Percent Paced: 1 %
Date Time Interrogation Session: 20221004070832
HighPow Impedance: 48 Ohm
Implantable Lead Implant Date: 20120907
Implantable Lead Location: 753860
Implantable Pulse Generator Implant Date: 20120907
Lead Channel Impedance Value: 450 Ohm
Lead Channel Pacing Threshold Amplitude: 0.75 V
Lead Channel Pacing Threshold Pulse Width: 0.5 ms
Lead Channel Sensing Intrinsic Amplitude: 12 mV
Lead Channel Setting Pacing Amplitude: 2.5 V
Lead Channel Setting Pacing Pulse Width: 0.5 ms
Lead Channel Setting Sensing Sensitivity: 0.5 mV
Pulse Gen Serial Number: 1027081

## 2021-09-05 NOTE — Telephone Encounter (Signed)
Pa done, waiting for Cover my meds

## 2021-09-10 ENCOUNTER — Telehealth: Payer: Self-pay

## 2021-09-10 NOTE — Telephone Encounter (Signed)
Prior Authorization for Pantoprazole has been approved from 09/09/2021 to 09/09/2024. Patient has been given this information as well as the pharmacy.

## 2021-09-11 NOTE — Progress Notes (Signed)
Remote ICD transmission.   

## 2021-09-27 ENCOUNTER — Telehealth: Payer: Self-pay

## 2021-09-27 NOTE — Telephone Encounter (Signed)
Prior Authorization sent for   [facility] (Key: [id number]) Rx #: [id number] Vascepa 1GM capsules

## 2021-09-30 NOTE — Telephone Encounter (Signed)
Copy of approval faxed to patient's pharmacy.

## 2021-09-30 NOTE — Telephone Encounter (Signed)
Prior Authorization request for Roy Turner has been approved from 09/27/21 through 09/27/22 per fax revceived from [facility].

## 2021-10-06 ENCOUNTER — Other Ambulatory Visit: Payer: Self-pay | Admitting: Cardiovascular Disease

## 2021-10-15 ENCOUNTER — Other Ambulatory Visit: Payer: Self-pay | Admitting: Family

## 2021-10-15 DIAGNOSIS — I5022 Chronic systolic (congestive) heart failure: Secondary | ICD-10-CM

## 2021-11-05 ENCOUNTER — Other Ambulatory Visit: Payer: Self-pay | Admitting: Cardiovascular Disease

## 2021-11-22 ENCOUNTER — Other Ambulatory Visit: Payer: Self-pay | Admitting: Cardiovascular Disease

## 2021-11-28 ENCOUNTER — Other Ambulatory Visit: Payer: Self-pay | Admitting: Cardiovascular Disease

## 2021-11-28 DIAGNOSIS — I5022 Chronic systolic (congestive) heart failure: Secondary | ICD-10-CM

## 2021-12-03 ENCOUNTER — Ambulatory Visit (INDEPENDENT_AMBULATORY_CARE_PROVIDER_SITE_OTHER): Payer: BLUE CROSS/BLUE SHIELD

## 2021-12-03 DIAGNOSIS — I255 Ischemic cardiomyopathy: Secondary | ICD-10-CM

## 2021-12-03 LAB — CUP PACEART REMOTE DEVICE CHECK
Battery Remaining Longevity: 19 mo
Battery Remaining Percentage: 16 %
Battery Voltage: 2.74 V
Brady Statistic RV Percent Paced: 1 %
Date Time Interrogation Session: 20230103024152
HighPow Impedance: 47 Ohm
Implantable Lead Implant Date: 20120907
Implantable Lead Location: 753860
Implantable Pulse Generator Implant Date: 20120907
Lead Channel Impedance Value: 440 Ohm
Lead Channel Pacing Threshold Amplitude: 0.75 V
Lead Channel Pacing Threshold Pulse Width: 0.5 ms
Lead Channel Sensing Intrinsic Amplitude: 12 mV
Lead Channel Setting Pacing Amplitude: 2.5 V
Lead Channel Setting Pacing Pulse Width: 0.5 ms
Lead Channel Setting Sensing Sensitivity: 0.5 mV
Pulse Gen Serial Number: 1027081

## 2021-12-12 NOTE — Progress Notes (Signed)
Remote ICD transmission.   

## 2022-01-06 ENCOUNTER — Other Ambulatory Visit: Payer: Self-pay | Admitting: Cardiovascular Disease

## 2022-01-11 ENCOUNTER — Other Ambulatory Visit: Payer: Self-pay | Admitting: Family

## 2022-01-11 DIAGNOSIS — I5022 Chronic systolic (congestive) heart failure: Secondary | ICD-10-CM

## 2022-01-13 NOTE — Telephone Encounter (Signed)
Rx(s) sent to pharmacy electronically.  

## 2022-02-01 ENCOUNTER — Other Ambulatory Visit: Payer: Self-pay | Admitting: Cardiovascular Disease

## 2022-02-02 ENCOUNTER — Other Ambulatory Visit: Payer: Self-pay | Admitting: Cardiovascular Disease

## 2022-02-22 ENCOUNTER — Other Ambulatory Visit: Payer: Self-pay | Admitting: Cardiovascular Disease

## 2022-02-23 ENCOUNTER — Other Ambulatory Visit: Payer: Self-pay | Admitting: Cardiovascular Disease

## 2022-02-23 DIAGNOSIS — I5022 Chronic systolic (congestive) heart failure: Secondary | ICD-10-CM

## 2022-02-28 ENCOUNTER — Ambulatory Visit: Payer: BLUE CROSS/BLUE SHIELD | Admitting: Nurse Practitioner

## 2022-02-28 ENCOUNTER — Encounter: Payer: Self-pay | Admitting: Nurse Practitioner

## 2022-02-28 ENCOUNTER — Other Ambulatory Visit
Admission: RE | Admit: 2022-02-28 | Discharge: 2022-02-28 | Disposition: A | Discharge status: Home / Self Care | Payer: BLUE CROSS/BLUE SHIELD | Attending: Nurse Practitioner | Admitting: Nurse Practitioner

## 2022-02-28 VITALS — BP 100/78 | HR 68 | Ht 72.0 in | Wt 243.0 lb

## 2022-02-28 DIAGNOSIS — I5022 Chronic systolic (congestive) heart failure: Secondary | ICD-10-CM | POA: Diagnosis not present

## 2022-02-28 DIAGNOSIS — I255 Ischemic cardiomyopathy: Secondary | ICD-10-CM

## 2022-02-28 DIAGNOSIS — I2510 Atherosclerotic heart disease of native coronary artery without angina pectoris: Secondary | ICD-10-CM

## 2022-02-28 DIAGNOSIS — E785 Hyperlipidemia, unspecified: Secondary | ICD-10-CM | POA: Diagnosis not present

## 2022-02-28 LAB — COMPREHENSIVE METABOLIC PANEL
ALT: 26 U/L (ref 0–44)
AST: 21 U/L (ref 15–41)
Albumin: 4.9 g/dL (ref 3.5–5.0)
Alkaline Phosphatase: 68 U/L (ref 38–126)
Anion gap: 12 (ref 5–15)
BUN: 21 mg/dL — ABNORMAL HIGH (ref 6–20)
CO2: 28 mmol/L (ref 22–32)
Calcium: 9.5 mg/dL (ref 8.9–10.3)
Chloride: 98 mmol/L (ref 98–111)
Creatinine, Ser: 1.07 mg/dL (ref 0.61–1.24)
GFR, Estimated: >60 mL/min (ref >60)
Glucose, Bld: 106 mg/dL — ABNORMAL HIGH (ref 70–99)
Potassium: 3.8 mmol/L (ref 3.5–5.1)
Sodium: 138 mmol/L (ref 135–145)
Total Bilirubin: 0.7 mg/dL (ref 0.3–1.2)
Total Protein: 8.4 g/dL — ABNORMAL HIGH (ref 6.5–8.1)

## 2022-02-28 LAB — LIPID PANEL
Cholesterol: 145 mg/dL (ref 0–200)
HDL: 41 mg/dL (ref >40)
LDL Cholesterol: 60 mg/dL (ref 0–99)
Total CHOL/HDL Ratio: 3.5 RATIO
Triglycerides: 218 mg/dL — ABNORMAL HIGH (ref <150)
VLDL: 44 mg/dL — ABNORMAL HIGH (ref 0–40)

## 2022-02-28 MED ORDER — ICOSAPENT ETHYL 1 G PO CAPS: ORAL_CAPSULE | ORAL | 3 refills | Status: DC

## 2022-02-28 MED ORDER — ICOSAPENT ETHYL 1 G PO CAPS: ORAL_CAPSULE | ORAL | 0 refills | Status: DC

## 2022-02-28 NOTE — Patient Instructions (Signed)
Medication Instructions:  ?No changes at this time.  ? ?*If you need a refill on your cardiac medications before your next appointment, please call your pharmacy* ? ? ?Lab Work: ?CMET, Lipid, and A1c. Go to [facility] and check in at registration. ? ?If you have labs (blood work) drawn today and your tests are completely normal, you will receive your results only by: ?MyChart Message (if you have MyChart) OR ?A paper copy in the mail ?If you have any lab test that is abnormal or we need to change your treatment, we will call you to review the results. ? ? ?Testing/Procedures: ?None ? ? ?Follow-Up: ?At [facility], you and your health needs are our priority.  As part of our continuing mission to provide you with exceptional heart care, we have created designated Provider Care Teams.  These Care Teams include your primary Cardiologist (physician) and Advanced Practice Providers (APPs -  Physician Assistants and Nurse Practitioners) who all work together to provide you with the care you need, when you need it. ? ? ?Your next appointment:   ?6 month(s) ? ?The format for your next appointment:   ?In Person ? ?Provider:   ?[name], MD or [name], NP ?

## 2022-02-28 NOTE — Progress Notes (Signed)
? ? ?Office Visit  ?  ?Patient Name: Roy Turner ?Date of Encounter: 02/28/2022 ? ?Primary Care Provider:  Pcp, No ?Primary Cardiologist:  Lorine BearsMuhammad Arida, MD ? ?Chief Complaint  ?  ?51 year old male with history of CAD status post anterior MI, HFrEF, ischemic cardiomyopathy with an EF of 25 to 30%, and hyperlipidemia, who presents for follow-up of CAD and heart failure. ? ?Past Medical History  ?  ?Past Medical History:  ?Diagnosis Date  ? CAD (coronary artery disease)   ? a. 07/2011 Anterior STEMI w/ CGS/Cath: LAD 100, LCX 80, RCA 100-->PCI aborted 2/2 dissection-->CABG x 3 (VG->LAD, Free LIMA->OM2, VG->RCA); b. 07/30/2011 Emergent Cath 2/2 VF arrest: patent grafts w/ CAD distal to graft touchdown; c. 03/2014 Neg MV, EF 29%; d. 12/2017 Cath: LM nl, LAD 90ost/p, 100p/m, D1 90, LCX 60, RCA 80p, 4793m, 100d ISR. VG->LAD ok, LIMA->OM2 ok, VG->RCA 100 w/ L->R collats->Med Rx  ? H/O cardiac arrest   ? a. 07/2011 VF arrest 2 days following CABG.  ? HFrEF (heart failure with reduced ejection fraction) (HCC)   ? a. 03/2016 Echo: EF 25-30%, antsept, ant, antlat, apical AK. Mildly dil LA.  ? Hyperlipidemia   ? Ischemic cardiomyopathy   ? a. 08/2011 s/p SJM [id number] Fortify ST VR single lead AICD; b. 03/2016 Echo: EF 25-30%.  ? ?Past Surgical History:  ?Procedure Laterality Date  ? CARDIAC CATHETERIZATION    ? [place]  ? CARDIAC DEFIBRILLATOR PLACEMENT  08/08/11  ? SJM Fortify ST VR [facility]  ? CORONARY ARTERY BYPASS GRAFT  07/28/11  ? LIMA to OM, SVG to LAD, SVGF to PDA  ? LEFT HEART CATH AND CORS/GRAFTS ANGIOGRAPHY Left 12/14/2017  ? Procedure: LEFT HEART CATH AND CORS/GRAFTS ANGIOGRAPHY;  Surgeon: Iran OuchArida, Muhammad A, MD;  Location: [facility] INVASIVE CV LAB;  Service: Cardiovascular;  Laterality: Left;  ? ? ?Allergies ? ?Allergies  ?Allergen Reactions  ? Penicillins Hives  ?  As a child  ? ? ?History of Present Illness  ?  ?51 year old male with the above complex past medical history including CAD,  HFrEF, ischemic cardiomyopathy, and hyperlipidemia.  In August 2012, he presented with anterior ST segment elevation MI, which was complicated by cardiogenic shock and VF arrest.  Catheterization revealed total occlusion of the LAD as well as the right coronary artery, with severe left circumflex stenosis.  Intervention was performed but per report, this was aborted due to dissection and he subsequently underwent CABG x3 on August 27th 2012.  2 days later, he suffered a VF arrest in the setting of hypokalemia, and relook catheterization showed patent grafts coronary disease distal to graft insertions.  He later underwent ICD placement and has been managed for ischemic cardiomyopathy and HFrEF stents.  His most recent echocardiogram was performed in April 2017 and showed an EF of 25 to 30% with anteroseptal, anterior, anterolateral, and apical akinesis.  He underwent repeat diagnostic catheterization in January 2019 in the setting of angina.  This showed a patent vein graft to the LAD and LIMA to the OM 2, with occlusion of the vein graft to the right coronary artery, and native multivessel disease. ? ?[name] was last seen in cardiology clinic in September 2022.  Since then, he notes that he has been stable from a cardiac standpoint.  He does have some degree of chronic orthostatic lightheadedness, most notable when stooping and then standing.  He will also sometimes experience mild and fleeting chest discomfort when squatting for too  long of a period of time.  This is unchanged dating back to his CABG.  He does not typically experience dyspnea on exertion, with usual activity, but also notes that he is not very active.  He has been dealing with ankle injuries intermittently over the past 2 years and recently hurt his ankle doing some yard work, though it appears to be on the mend at this point.  He denies palpitations, PND, orthopnea, syncope, or early satiety.  If he misses a dose of torsemide, which is not  often, he will experience right greater than left lower extremity swelling. ? ?Home Medications  ?  ?Prior to Admission medications   ?Medication Sig Start Date End Date Taking? Authorizing Provider  ?aspirin 81 MG tablet Take 81 mg by mouth daily.   Yes [provider]  ?carvedilol (COREG) 6.25 MG tablet TAKE 1 TABLET BY MOUTH TWICE A DAY 02/24/22  Yes [name], MD  ?ENTRESTO 24-26 MG TAKE 1 TABLET BY MOUTH TWICE A DAY 02/24/22  Yes [name], MD  ?eplerenone (INSPRA) 25 MG tablet TAKE 1 TABLET BY MOUTH EVERY DAY 02/24/22  Yes [name], MD  ?ezetimibe (ZETIA) 10 MG tablet TAKE 1 TABLET BY MOUTH EVERY DAY 02/24/22  Yes [name], MD  ?KLOR-CON M20 20 MEQ tablet TAKE 1 TABLET BY MOUTH EVERY DAY 02/24/22  Yes [name], MD  ?magnesium oxide (MAG-OX) 400 MG tablet Take 1 tablet (400 mg total) by mouth 2 (two) times daily. 12/25/14  Yes [name], MD  ?Multiple Vitamin (MULTIVITAMIN) capsule Take 1 capsule by mouth daily.   Yes [provider]  ?nitroGLYCERIN (NITROSTAT) 0.4 MG SL tablet Place 1 tablet (0.4 mg total) under the tongue every 5 (five) minutes as needed for chest pain. 08/29/21  Yes [name], MD  ?pantoprazole (PROTONIX) 40 MG tablet TAKE 1 TABLET BY MOUTH EVERY DAY 02/24/22  Yes [name], MD  ?rosuvastatin (CRESTOR) 40 MG tablet TAKE 1 TABLET BY MOUTH EVERY DAY 02/24/22  Yes [name], MD  ?torsemide (DEMADEX) 20 MG tablet TAKE TWO TABLETS (40MG ) DAILY IN THE MORNING AND ONE TABLET (20MG ) IN THE AFTERNOON AS-NEEDED. 01/13/22  Yes , MD  ?VASCEPA 1 g capsule TAKE 2 CAPSULES BY MOUTH 2 TIMES DAILY. 02/24/22  Yes [name], MD  ?  ?  ? ?Review of Systems  ?  ?Occasional orthostatic lightheadedness especially when squatting and then standing.  Likewise, he has some degree of chronic, mild and fleeting chest discomfort that occurs if he squats for too long of a period of time.  He does not experience  dyspnea on exertion with usual activity but might with any increase in activity.  His chronic, mild right greater than left lower extremity swelling which is generally well managed with torsemide.  He denies palpitations, PND, orthopnea, syncope, or early satiety.  All other systems reviewed and are otherwise negative except as noted above. ?  ? ?Physical Exam  ?  ?VS:  BP 100/78 (BP Location: Left Arm, Patient Position: Sitting, Cuff Size: Large)   Pulse 68   Ht 6' (1.829 m)   Wt 243 lb (110.2 kg)   SpO2 97%   BMI 32.96 kg/m?  , BMI Body mass index is 32.96 kg/m?. ?    ?GEN: Well nourished, well developed, in no acute distress. ?HEENT: normal. ?Neck: Supple, no JVD, carotid bruits, or masses. ?Cardiac: RRR, no murmurs, rubs, or gallops. No  clubbing, cyanosis, edema.  Radials/PT 2+ and equal bilaterally.  ?Respiratory:  Respirations regular and unlabored, clear to auscultation bilaterally. ?GI: Soft, nontender, nondistended, BS + x 4. ?MS: no deformity or atrophy. ?Skin: warm and dry, no rash. ?Neuro:  Strength and sensation are intact. ?Psych: Normal affect. ? ?Accessory Clinical Findings  ?  ?ECG personally reviewed by me today -regular sinus rhythm, 68, anteroseptal infarct- no acute changes. ? ?Lab Results  ?Component Value Date  ? WBC 6.7 08/29/2021  ? HGB 14.7 08/29/2021  ? HCT 42.8 08/29/2021  ? MCV 89 08/29/2021  ? PLT 202 08/29/2021  ? ?Lab Results  ?Component Value Date  ? CREATININE 1.04 08/29/2021  ? BUN 14 08/29/2021  ? NA 141 08/29/2021  ? K 4.2 08/29/2021  ? CL 100 08/29/2021  ? CO2 25 08/29/2021  ? ?Lab Results  ?Component Value Date  ? ALT 26 08/29/2021  ? AST 25 08/29/2021  ? ALKPHOS 79 08/29/2021  ? BILITOT 0.7 08/29/2021  ? ?Lab Results  ?Component Value Date  ? CHOL 126 08/29/2021  ? HDL 37 (L) 08/29/2021  ? LDLCALC 48 08/29/2021  ? TRIG 261 (H) 08/29/2021  ? CHOLHDL 3.4 08/29/2021  ?  ? ?Assessment & Plan  ?  ?1.  Coronary artery disease: Status post anterior STEMI and CABG x3 in August  2012.  Most recent catheterization in 2019 showed 2 of 3 patent grafts with an occluded vein graft to the right coronary artery, and known occlusions of the LAD and distal RCA.  He has been medically managed since t

## 2022-03-01 LAB — HEMOGLOBIN A1C
Hgb A1c MFr Bld: 5.5 % (ref 4.8–5.6)
Mean Plasma Glucose: 111.15 mg/dL

## 2022-03-03 NOTE — Progress Notes (Signed)
Results viewed by patient via MyChart. 

## 2022-03-04 ENCOUNTER — Ambulatory Visit (INDEPENDENT_AMBULATORY_CARE_PROVIDER_SITE_OTHER): Payer: BLUE CROSS/BLUE SHIELD

## 2022-03-04 DIAGNOSIS — I255 Ischemic cardiomyopathy: Secondary | ICD-10-CM | POA: Diagnosis not present

## 2022-03-05 LAB — CUP PACEART REMOTE DEVICE CHECK
Battery Remaining Longevity: 13 mo
Battery Remaining Percentage: 11 %
Battery Voltage: 2.69 V
Brady Statistic RV Percent Paced: 1 %
Date Time Interrogation Session: 20230404020953
HighPow Impedance: 46 Ohm
Implantable Lead Implant Date: 20120907
Implantable Lead Location: 753860
Implantable Pulse Generator Implant Date: 20120907
Lead Channel Impedance Value: 460 Ohm
Lead Channel Pacing Threshold Amplitude: 0.75 V
Lead Channel Pacing Threshold Pulse Width: 0.5 ms
Lead Channel Sensing Intrinsic Amplitude: 12 mV
Lead Channel Setting Pacing Amplitude: 2.5 V
Lead Channel Setting Pacing Pulse Width: 0.5 ms
Lead Channel Setting Sensing Sensitivity: 0.5 mV
Pulse Gen Serial Number: 1027081

## 2022-03-11 ENCOUNTER — Ambulatory Visit (INDEPENDENT_AMBULATORY_CARE_PROVIDER_SITE_OTHER): Payer: BLUE CROSS/BLUE SHIELD | Admitting: Internal Medicine

## 2022-03-11 ENCOUNTER — Encounter: Payer: Self-pay | Admitting: Internal Medicine

## 2022-03-11 VITALS — BP 102/70 | HR 61 | Ht 72.0 in | Wt 250.0 lb

## 2022-03-11 DIAGNOSIS — I255 Ischemic cardiomyopathy: Secondary | ICD-10-CM

## 2022-03-11 DIAGNOSIS — Z95810 Presence of automatic (implantable) cardiac defibrillator: Secondary | ICD-10-CM

## 2022-03-11 DIAGNOSIS — I5022 Chronic systolic (congestive) heart failure: Secondary | ICD-10-CM | POA: Diagnosis not present

## 2022-03-11 NOTE — Progress Notes (Signed)
? ? ? ? ?Patient Care Team: ?Pcp, No as PCP - General ?[name], MD as PCP - Cardiology (Cardiology) ? ? ?HPI ? ?Roy Turner is a 51 y.o. male ?Seen in follow-up of an [employer] Jude  ICD implanted 2010 for secondary prevention.  He has a history of cardiac arrest in the context of cardiogenic shock and acute LAD and RCA occlusion. Efforts at percutaneous intervention were complicated by dissection and he underwent bypass surgery followed 2 days later by the VF arrest. Repeat catheterization demonstrated no clear triggering event and he underwent ICD implantation ?  ?Dr. Ranae Palms note 9/21 notes that his son was diagnosed with Ewing's.  He has completed therapy.  No longer playing football.  Will matriculate to [facility] in the fall. ? ?Patient denies chest pain or shortness of breath.  Has tried exercise but has repeated foot problems.  Not been able to lose weight.  No edema.  No nocturnal dyspnea. ? ?  ? ?DATE TEST EF   ?4/17 Echo   25-30 %   ?1/19 LHC   LIMA-LAD p; SVG-OM2 -p ?SVG-RPDA Total ?Severe native 3VCAD  ?     ? ? ? ? ?Past Medical History:  ?Diagnosis Date  ? CAD (coronary artery disease)   ? a. 07/2011 Anterior STEMI w/ CGS/Cath: LAD 100, LCX 80, RCA 100-->PCI aborted 2/2 dissection-->CABG x 3 (VG->LAD, Free LIMA->OM2, VG->RCA); b. 07/30/2011 Emergent Cath 2/2 VF arrest: patent grafts w/ CAD distal to graft touchdown; c. 03/2014 Neg MV, EF 29%; d. 12/2017 Cath: LM nl, LAD 90ost/p, 100p/m, D1 90, LCX 60, RCA 80p, 33m, 100d ISR. VG->LAD ok, LIMA->OM2 ok, VG->RCA 100 w/ L->R collats->Med Rx  ? H/O cardiac arrest   ? a. 07/2011 VF arrest 2 days following CABG.  ? HFrEF (heart failure with reduced ejection fraction) (HCC)   ? a. 03/2016 Echo: EF 25-30%, antsept, ant, antlat, apical AK. Mildly dil LA.  ? Hyperlipidemia   ? Ischemic cardiomyopathy   ? a. 08/2011 s/p SJM [id number] Fortify ST VR single lead AICD; b. 03/2016 Echo: EF 25-30%.  ? ? ?Past Surgical History:  ?Procedure Laterality Date  ? CARDIAC CATHETERIZATION     ? [place]  ? CARDIAC DEFIBRILLATOR PLACEMENT  08/08/11  ? SJM Fortify ST VR implanted by Dr Selena Batten [facility]  ? CORONARY ARTERY BYPASS GRAFT  07/28/11  ? LIMA to OM, SVG to LAD, SVGF to PDA  ? LEFT HEART CATH AND CORS/GRAFTS ANGIOGRAPHY Left 12/14/2017  ? Procedure: LEFT HEART CATH AND CORS/GRAFTS ANGIOGRAPHY;  Surgeon: [name], MD;  Location: [facility] INVASIVE CV LAB;  Service: Cardiovascular;  Laterality: Left;  ? ? ?Current Outpatient Medications  ?Medication Sig Dispense Refill  ? aspirin 81 MG tablet Take 81 mg by mouth daily.    ? carvedilol (COREG) 6.25 MG tablet TAKE 1 TABLET BY MOUTH TWICE A DAY 180 tablet 0  ? ENTRESTO 24-26 MG TAKE 1 TABLET BY MOUTH TWICE A DAY 60 tablet 0  ? eplerenone (INSPRA) 25 MG tablet TAKE 1 TABLET BY MOUTH EVERY DAY 90 tablet 0  ? ezetimibe (ZETIA) 10 MG tablet TAKE 1 TABLET BY MOUTH EVERY DAY 90 tablet 0  ? icosapent Ethyl (VASCEPA) 1 g capsule TAKE 2 CAPSULES BY MOUTH 2 TIMES DAILY. 180 capsule 3  ? KLOR-CON M20 20 MEQ tablet TAKE 1 TABLET BY MOUTH EVERY DAY 90 tablet 0  ? magnesium oxide (MAG-OX) 400 MG tablet Take 1 tablet (400 mg total) by mouth 2 (  two) times daily. 6 tablet 0  ? Multiple Vitamin (MULTIVITAMIN) capsule Take 1 capsule by mouth daily.    ? nitroGLYCERIN (NITROSTAT) 0.4 MG SL tablet Place 1 tablet (0.4 mg total) under the tongue every 5 (five) minutes as needed for chest pain. 25 tablet 1  ? pantoprazole (PROTONIX) 40 MG tablet TAKE 1 TABLET BY MOUTH EVERY DAY 90 tablet 0  ? rosuvastatin (CRESTOR) 40 MG tablet TAKE 1 TABLET BY MOUTH EVERY DAY 90 tablet 0  ? torsemide (DEMADEX) 20 MG tablet TAKE TWO TABLETS (40MG ) DAILY IN THE MORNING AND ONE TABLET (20MG ) IN THE AFTERNOON AS-NEEDED. 270 tablet 1  ? ?No current facility-administered medications for this visit.  ? ? ?Allergies  ?Allergen Reactions  ? Penicillins Hives  ?  As a child  ? ? ? ? ?Review of Systems negative except from HPI and PMH ? ?Physical Exam ?BP 102/70 (BP Location: Left  Arm, Patient Position: Sitting, Cuff Size: Normal)   Pulse 61   Ht 6' (1.829 m)   Wt 250 lb (113.4 kg)   SpO2 96%   BMI 33.91 kg/m?  ?Well developed and well nourished in no acute distress ?HENT normal ?Neck supple with JVP-flat ?Clear ?Device pocket well healed; without hematoma or erythema.  There is no tethering  ?Regular rate and rhythm, no  gallop No  murmur ?Abd-soft with active BS ?No Clubbing cyanosis  edema ?Skin-warm and dry ?A & Oriented  Grossly normal sensory and motor function ? ?ECG sinus at 61 ?Interval 20/10/41 ?Anterior Q waves ? ?Assessment and  Plan ? ?Ischemic cardiomyopathy ? ?Morbid obesity ? ?Congestive heart failure-chronic-systolic ?  ?Implantable defibrillator St. Jude   ? ?Euvolemic.  Continue his torsemide and eplerenone ? ?  ?

## 2022-03-11 NOTE — Patient Instructions (Signed)
Medication Instructions:  - Your physician recommends that you continue on your current medications as directed. Please refer to the Current Medication list given to you today.  *If you need a refill on your cardiac medications before your next appointment, please call your pharmacy*   Lab Work: - none ordered  If you have labs (blood work) drawn today and your tests are completely normal, you will receive your results only by: MyChart Message (if you have MyChart) OR A paper copy in the mail If you have any lab test that is abnormal or we need to change your treatment, we will call you to review the results.   Testing/Procedures: - none ordered   Follow-Up: At [facility], you and your health needs are our priority.  As part of our continuing mission to provide you with exceptional heart care, we have created designated Provider Care Teams.  These Care Teams include your primary Cardiologist (physician) and Advanced Practice Providers (APPs -  Physician Assistants and Nurse Practitioners) who all work together to provide you with the care you need, when you need it.  We recommend signing up for the patient portal called "MyChart".  Sign up information is provided on this After Visit Summary.  MyChart is used to connect with patients for Virtual Visits (Telemedicine).  Patients are able to view lab/test results, encounter notes, upcoming appointments, etc.  Non-urgent messages can be sent to your provider as well.   To learn more about what you can do with MyChart, go to [email or web address].    Your next appointment:   1 year(s)  The format for your next appointment:   In Person  Provider:   Steven Klein, MD    Other Instructions N/a  Important Information About Sugar       

## 2022-03-19 NOTE — Progress Notes (Signed)
Remote ICD transmission.   

## 2022-03-29 ENCOUNTER — Other Ambulatory Visit: Payer: Self-pay | Admitting: Cardiovascular Disease

## 2022-05-22 ENCOUNTER — Other Ambulatory Visit: Payer: Self-pay | Admitting: Cardiovascular Disease

## 2022-05-22 DIAGNOSIS — I5022 Chronic systolic (congestive) heart failure: Secondary | ICD-10-CM

## 2022-06-04 ENCOUNTER — Ambulatory Visit (INDEPENDENT_AMBULATORY_CARE_PROVIDER_SITE_OTHER): Payer: BLUE CROSS/BLUE SHIELD

## 2022-06-04 DIAGNOSIS — I255 Ischemic cardiomyopathy: Secondary | ICD-10-CM

## 2022-06-06 LAB — CUP PACEART REMOTE DEVICE CHECK
Battery Remaining Longevity: 11 mo
Battery Remaining Percentage: 9 %
Battery Voltage: 2.66 V
Brady Statistic RV Percent Paced: 0 %
Date Time Interrogation Session: 20230704020030
HighPow Impedance: 49 Ohm
Implantable Lead Implant Date: 20120907
Implantable Lead Location: 753860
Implantable Pulse Generator Implant Date: 20120907
Lead Channel Impedance Value: 460 Ohm
Lead Channel Pacing Threshold Amplitude: 0.75 V
Lead Channel Pacing Threshold Pulse Width: 0.5 ms
Lead Channel Sensing Intrinsic Amplitude: 12 mV
Lead Channel Setting Pacing Amplitude: 2.5 V
Lead Channel Setting Pacing Pulse Width: 0.5 ms
Lead Channel Setting Sensing Sensitivity: 0.5 mV
Pulse Gen Serial Number: 1027081

## 2022-06-25 NOTE — Progress Notes (Signed)
Remote ICD transmission.   

## 2022-08-07 ENCOUNTER — Encounter: Payer: Self-pay | Admitting: *Deleted

## 2022-08-07 DIAGNOSIS — Z006 Encounter for examination for normal comparison and control in clinical research program: Secondary | ICD-10-CM

## 2022-08-07 NOTE — Research (Signed)
Spoke with Roy Turner about [employer], he states he lives to far away. Encouraged to call me if something changes  in the future. Voices understanding.

## 2022-08-17 ENCOUNTER — Other Ambulatory Visit: Payer: Self-pay | Admitting: Cardiovascular Disease

## 2022-08-17 DIAGNOSIS — I5022 Chronic systolic (congestive) heart failure: Secondary | ICD-10-CM

## 2022-08-25 ENCOUNTER — Other Ambulatory Visit
Admission: RE | Admit: 2022-08-25 | Discharge: 2022-08-25 | Disposition: A | Discharge status: Home / Self Care | Payer: BLUE CROSS/BLUE SHIELD | Source: Ambulatory Visit | Attending: Nurse Practitioner | Admitting: Nurse Practitioner

## 2022-08-25 ENCOUNTER — Ambulatory Visit: Payer: BLUE CROSS/BLUE SHIELD | Attending: Nurse Practitioner | Admitting: Nurse Practitioner

## 2022-08-25 ENCOUNTER — Encounter: Payer: Self-pay | Admitting: Nurse Practitioner

## 2022-08-25 VITALS — BP 112/80 | HR 73 | Ht 72.0 in | Wt 242.0 lb

## 2022-08-25 DIAGNOSIS — E781 Pure hyperglyceridemia: Secondary | ICD-10-CM

## 2022-08-25 DIAGNOSIS — I5022 Chronic systolic (congestive) heart failure: Secondary | ICD-10-CM | POA: Diagnosis present

## 2022-08-25 DIAGNOSIS — Z79899 Other long term (current) drug therapy: Secondary | ICD-10-CM | POA: Diagnosis present

## 2022-08-25 DIAGNOSIS — I255 Ischemic cardiomyopathy: Secondary | ICD-10-CM | POA: Insufficient documentation

## 2022-08-25 DIAGNOSIS — I25118 Atherosclerotic heart disease of native coronary artery with other forms of angina pectoris: Secondary | ICD-10-CM

## 2022-08-25 DIAGNOSIS — E785 Hyperlipidemia, unspecified: Secondary | ICD-10-CM | POA: Diagnosis not present

## 2022-08-25 LAB — BASIC METABOLIC PANEL
Anion gap: 11 (ref 5–15)
BUN: 15 mg/dL (ref 6–20)
CO2: 30 mmol/L (ref 22–32)
Calcium: 9.6 mg/dL (ref 8.9–10.3)
Chloride: 100 mmol/L (ref 98–111)
Creatinine, Ser: 1.21 mg/dL (ref 0.61–1.24)
GFR, Estimated: >60 mL/min (ref >60)
Glucose, Bld: 107 mg/dL — ABNORMAL HIGH (ref 70–99)
Potassium: 4 mmol/L (ref 3.5–5.1)
Sodium: 141 mmol/L (ref 135–145)

## 2022-08-25 NOTE — Patient Instructions (Signed)
Medication Instructions:  - Your physician recommends that you continue on your current medications as directed. Please refer to the Current Medication list given to you today.  *If you need a refill on your cardiac medications before your next appointment, please call your pharmacy*   Lab Work: - Your physician recommends that you have lab work today:   [place] [facility] at [facility] 1st desk on the right to check in (REGISTRATION)  Lab hours: Monday- Friday (7:30 am- 5:30 pm)   If you have labs (blood work) drawn today and your tests are completely normal, you will receive your results only by: MyChart Message (if you have MyChart) OR A paper copy in the mail If you have any lab test that is abnormal or we need to change your treatment, we will call you to review the results.   Testing/Procedures: - none ordered   Follow-Up: At [facility], you and your health needs are our priority.  As part of our continuing mission to provide you with exceptional heart care, we have created designated Provider Care Teams.  These Care Teams include your primary Cardiologist (physician) and Advanced Practice Providers (APPs -  Physician Assistants and Nurse Practitioners) who all work together to provide you with the care you need, when you need it.  We recommend signing up for the patient portal called "MyChart".  Sign up information is provided on this After Visit Summary.  MyChart is used to connect with patients for Virtual Visits (Telemedicine).  Patients are able to view lab/test results, encounter notes, upcoming appointments, etc.  Non-urgent messages can be sent to your provider as well.   To learn more about what you can do with MyChart, go to [email or web address].    Your next appointment:   6 month(s)  The format for your next appointment:   In Person  Provider:   Kathlyn Sacramento, MD    Other Instructions N/a  Important Information About Sugar      s

## 2022-08-25 NOTE — Progress Notes (Signed)
Office Visit    Patient Name: Roy Turner Date of Encounter: 08/25/2022  Primary Care Provider:  Pcp, No Primary Cardiologist:  Roy Sacramento, MD  Chief Complaint    51 year old male with a history of CAD status post anterior MI, HFrEF, ischemic cardiomyopathy with an EF of 25 to 30%, and hyperlipidemia, who presents for follow-up of CAD and heart failure.  Past Medical History    Past Medical History:  Diagnosis Date   CAD (coronary artery disease)    a. 07/2011 Anterior STEMI w/ CGS/Cath: LAD 100, LCX 80, RCA 100-->PCI aborted 2/2 dissection-->CABG x 3 (VG->LAD, Free LIMA->OM2, VG->RCA); b. 07/30/2011 Emergent Cath 2/2 VF arrest: patent grafts w/ CAD distal to graft touchdown; c. 03/2014 Neg MV, EF 29%; d. 12/2017 Cath: LM nl, LAD 90ost/p, 100p/m, D1 90, LCX 60, RCA 80p, 18m, 100d ISR. VG->LAD ok, LIMA->OM2 ok, VG->RCA 100 w/ L->R collats->Med Rx   H/O cardiac arrest    a. 07/2011 VF arrest 2 days following CABG.   HFrEF (heart failure with reduced ejection fraction) ([place])    a. 03/2016 Echo: EF 25-30%, antsept, ant, antlat, apical AK. Mildly dil LA.   Hyperlipidemia    Ischemic cardiomyopathy    a. 08/2011 s/p SJM [id number] Fortify ST VR single lead AICD; b. 03/2016 Echo: EF 25-30%.   Past Surgical History:  Procedure Laterality Date   [place]  08/08/11   SJM Fortify ST VR implanted by Dr Roy Turner at [facility]  07/28/11   LIMA to OM, SVG to LAD, SVGF to PDA   LEFT HEART CATH AND CORS/GRAFTS ANGIOGRAPHY Left 12/14/2017   Procedure: LEFT HEART CATH AND CORS/GRAFTS ANGIOGRAPHY;  Surgeon: Roy Hampshire, MD;  Location: [place] CV LAB;  Service: Cardiovascular;  Laterality: Left;    Allergies  Allergies  Allergen Reactions   Penicillins Hives    As a child    History of Present Illness    51 year old male with above complex medical history including CAD,  HFrEF, ischemic cardiomyopathy, and hyperlipidemia.  In August 2012, he presented with anterior STEMI which was complicated by cardiogenic shock and VF arrest.  Catheterization revealed total occlusion of the LAD as well as the right coronary artery, with severe left circumflex stenosis.  Intervention was performed but per report, this was aborted due to dissection and he subsequently underwent CABG x3 on July 28, 2011.  2 days later, he suffered a VF arrest in the setting of hypokalemia, and relook catheterization showed patent grafts with coronary disease distal to graft insertions.  He later underwent ICD placement has been managed for ischemic cardiomyopathy and HFrEF since.  Most recent echo in April 2017 showed an EF of 25 to 30% with anteroseptal, anterior, anterolateral, and apical akinesis.  Repeat diagnostic catheterization in January 2019 in the setting of angina showed patent vein graft to LAD, patent LIMA to the OM 2, occlusion of the vein graft to the RCA, and native multivessel disease.  Mr. Roy Turner was last seen in general cardiology clinic in March 2023 at which time he reported chronic orthostatic lightheadedness and exertional angina, both of which were stable.  Since then, he has remained active and is exercising on his treadmill and doing some light weight lifting 5 days a week.  Workouts are typically about 30 minutes.  Towards the end of his workouts, he does note mild chest pressure.  This has  been stable over time and when this occurs, he will continue exercising at the same speed but will not increase speed any further.  Symptoms resolve with rest.  He does not experience resting angina.  Overall, he has been doing well.  He continues to experience some orthostatic lightheadedness, but this remained stable.  He and his wife are getting used to having his son away at college, currently a Roy Turner.  He denies palpitations, PND, orthopnea, dizziness, syncope, or early satiety.  He has  some degree of mild, right greater than left lower extremity swelling.  Home Medications    Current Outpatient Medications  Medication Sig Dispense Refill   aspirin 81 MG tablet Take 81 mg by mouth daily.     carvedilol (COREG) 6.25 MG tablet TAKE 1 TABLET BY MOUTH TWICE A DAY 180 tablet 0   ENTRESTO 24-26 MG TAKE 1 TABLET BY MOUTH TWICE A DAY 60 tablet 4   eplerenone (INSPRA) 25 MG tablet TAKE 1 TABLET BY MOUTH EVERY DAY 90 tablet 0   ezetimibe (ZETIA) 10 MG tablet TAKE 1 TABLET BY MOUTH EVERY DAY 90 tablet 0   icosapent Ethyl (VASCEPA) 1 g capsule TAKE 2 CAPSULES BY MOUTH 2 TIMES DAILY. 180 capsule 3   KLOR-CON M20 20 MEQ tablet TAKE 1 TABLET BY MOUTH EVERY DAY 90 tablet 0   magnesium oxide (MAG-OX) 400 MG tablet Take 1 tablet (400 mg total) by mouth 2 (two) times daily. 6 tablet 0   Multiple Vitamin (MULTIVITAMIN) capsule Take 1 capsule by mouth daily.     nitroGLYCERIN (NITROSTAT) 0.4 MG SL tablet Place 1 tablet (0.4 mg total) under the tongue every 5 (five) minutes as needed for chest pain. 25 tablet 1   pantoprazole (PROTONIX) 40 MG tablet TAKE 1 TABLET BY MOUTH EVERY DAY 90 tablet 0   rosuvastatin (CRESTOR) 40 MG tablet TAKE 1 TABLET BY MOUTH EVERY DAY 90 tablet 0   torsemide (DEMADEX) 20 MG tablet TAKE TWO TABLETS (40MG ) DAILY IN THE MORNING AND ONE TABLET (20MG ) IN THE AFTERNOON AS-NEEDED. 270 tablet 0   No current facility-administered medications for this visit.     Review of Systems    Chronic, stable exertional angina higher levels of activity.  Mild right greater than left lower extremity swelling.  Chronic mild orthostasis.  He denies palpitations, dyspnea, PND, orthopnea, syncope, or early satiety.  All other systems reviewed and are otherwise negative except as noted above.    Physical Exam    VS:  BP 112/80 (BP Location: Left Arm, Patient Position: Sitting, Cuff Size: Large)   Pulse 73   Ht 6' (1.829 m)   Wt 242 lb (109.8 kg)   SpO2 97%   BMI 32.82 kg/m  , BMI  Body mass index is 32.82 kg/m.     GEN: Well nourished, well developed, in no acute distress. HEENT: normal. Neck: Supple, no JVD, carotid bruits, or masses. Cardiac: RRR, no murmurs, rubs, or gallops. No clubbing, cyanosis, trace bilateral ankle edema.  Radials/PT 2+ and equal bilaterally.  Respiratory:  Respirations regular and unlabored, clear to auscultation bilaterally. GI: Soft, nontender, nondistended, BS + x 4. MS: no deformity or atrophy. Skin: warm and dry, no rash. Neuro:  Strength and sensation are intact. Psych: Normal affect.  Accessory Clinical Findings    ECG personally reviewed by me today -regular sinus rhythm, 73, prior anterior infarct- no acute changes.  Lab Results  Component Value Date   WBC 6.7 08/29/2021  HGB 14.7 08/29/2021   HCT 42.8 08/29/2021   MCV 89 08/29/2021   PLT 202 08/29/2021   Lab Results  Component Value Date   CREATININE 1.07 02/28/2022   BUN 21 (H) 02/28/2022   NA 138 02/28/2022   K 3.8 02/28/2022   CL 98 02/28/2022   CO2 28 02/28/2022   Lab Results  Component Value Date   ALT 26 02/28/2022   AST 21 02/28/2022   ALKPHOS 68 02/28/2022   BILITOT 0.7 02/28/2022   Lab Results  Component Value Date   CHOL 145 02/28/2022   HDL 41 02/28/2022   LDLCALC 60 02/28/2022   TRIG 218 (H) 02/28/2022   CHOLHDL 3.5 02/28/2022    Lab Results  Component Value Date   HGBA1C 5.5 02/28/2022    Assessment & Plan    1.  Coronary artery disease: Status post anterior STEMI and CABG x3 in August 2012.  Most recent catheterization in 2019, showed 2 of 3 patent grafts with occluded vein graft to the right coronary artery, and known occlusions of the native LAD and distal RCA.  He has been medically managed since then with some degree of chronic angina that occurs at higher levels of activity.  He is exercising 5 days a week for 30 minutes at a time on his treadmill and also doing some light weights.  He tolerates this but does Has his typical angina  towards the end of workouts.  Overall, the symptoms have not changed over time and resolve quickly with rest.  He remains on aspirin, beta-blocker, Zetia, and statin therapy.  2.  Chronic heart failure with reduced ejection fraction/ischemic cardiomyopathy: EF 25 to 30% by echo in April 2017.  He has been doing well without significant dyspnea.  He has mild and chronic right greater than left lower extremity swelling dating back to his bypass surgery.  He is relatively euvolemic on examination today and weight has been stable.  He remains on beta-blocker, Entresto, Inspra, and torsemide.  Follow-up basic metabolic panel today.  We had previously discussed SGLT2 inhibitor and deferred in the setting of relatively soft blood pressures and chronic orthostasis.  3.  Hyperlipidemia/hypertriglyceridemia: Triglycerides of 218 with an LDL of 60 in March.  He remains on high potency rosuvastatin, Zetia, and Vascepa.  4.  Disposition: Follow-up basic metabolic panel.  Follow-up in 6 months or sooner if necessary.   [name], NP 08/25/2022, 3:57 PM

## 2022-09-02 ENCOUNTER — Ambulatory Visit (INDEPENDENT_AMBULATORY_CARE_PROVIDER_SITE_OTHER): Payer: BLUE CROSS/BLUE SHIELD

## 2022-09-02 DIAGNOSIS — I255 Ischemic cardiomyopathy: Secondary | ICD-10-CM | POA: Diagnosis not present

## 2022-09-02 LAB — CUP PACEART REMOTE DEVICE CHECK
Battery Remaining Longevity: 9 mo
Battery Remaining Percentage: 7 %
Battery Voltage: 2.65 V
Brady Statistic RV Percent Paced: 1 %
Date Time Interrogation Session: 20231003020023
HighPow Impedance: 56 Ohm
Implantable Lead Implant Date: 20120907
Implantable Lead Location: 753860
Implantable Pulse Generator Implant Date: 20120907
Lead Channel Impedance Value: 480 Ohm
Lead Channel Pacing Threshold Amplitude: 0.75 V
Lead Channel Pacing Threshold Pulse Width: 0.5 ms
Lead Channel Sensing Intrinsic Amplitude: 12 mV
Lead Channel Setting Pacing Amplitude: 2.5 V
Lead Channel Setting Pacing Pulse Width: 0.5 ms
Lead Channel Setting Sensing Sensitivity: 0.5 mV
Pulse Gen Serial Number: 1027081

## 2022-09-04 ENCOUNTER — Other Ambulatory Visit: Payer: Self-pay | Admitting: Cardiovascular Disease

## 2022-09-12 ENCOUNTER — Other Ambulatory Visit: Payer: Self-pay | Admitting: Cardiovascular Disease

## 2022-09-12 NOTE — Progress Notes (Signed)
Remote ICD transmission.   

## 2022-09-24 ENCOUNTER — Other Ambulatory Visit: Payer: Self-pay | Admitting: Cardiovascular Disease

## 2022-09-24 DIAGNOSIS — I5022 Chronic systolic (congestive) heart failure: Secondary | ICD-10-CM

## 2022-10-01 ENCOUNTER — Telehealth: Payer: Self-pay

## 2022-10-01 NOTE — Telephone Encounter (Signed)
Prior Authorization initiated by [email or web address]  KEY: [id number]  Response: This request has received an approval.

## 2022-10-10 ENCOUNTER — Other Ambulatory Visit: Payer: Self-pay | Admitting: Cardiovascular Disease

## 2022-10-16 ENCOUNTER — Other Ambulatory Visit: Payer: Self-pay | Admitting: Cardiovascular Disease

## 2022-11-14 ENCOUNTER — Other Ambulatory Visit: Payer: Self-pay | Admitting: Cardiovascular Disease

## 2022-11-14 DIAGNOSIS — I5022 Chronic systolic (congestive) heart failure: Secondary | ICD-10-CM

## 2022-12-02 ENCOUNTER — Ambulatory Visit (INDEPENDENT_AMBULATORY_CARE_PROVIDER_SITE_OTHER): Payer: BLUE CROSS/BLUE SHIELD

## 2022-12-02 DIAGNOSIS — I255 Ischemic cardiomyopathy: Secondary | ICD-10-CM | POA: Diagnosis not present

## 2022-12-02 LAB — CUP PACEART REMOTE DEVICE CHECK
Battery Remaining Longevity: 8 mo
Battery Remaining Percentage: 6 %
Battery Voltage: 2.63 V
Brady Statistic RV Percent Paced: 1 %
Date Time Interrogation Session: 20240102020015
HighPow Impedance: 52 Ohm
Implantable Lead Connection Status: 753985
Implantable Lead Implant Date: 20120907
Implantable Lead Location: 753860
Implantable Pulse Generator Implant Date: 20120907
Lead Channel Impedance Value: 450 Ohm
Lead Channel Pacing Threshold Amplitude: 0.75 V
Lead Channel Pacing Threshold Pulse Width: 0.5 ms
Lead Channel Sensing Intrinsic Amplitude: 12 mV
Lead Channel Setting Pacing Amplitude: 2.5 V
Lead Channel Setting Pacing Pulse Width: 0.5 ms
Lead Channel Setting Sensing Sensitivity: 0.5 mV
Pulse Gen Serial Number: 1027081

## 2022-12-13 ENCOUNTER — Other Ambulatory Visit: Payer: Self-pay | Admitting: Nurse Practitioner

## 2022-12-19 ENCOUNTER — Other Ambulatory Visit: Payer: Self-pay | Admitting: Cardiovascular Disease

## 2022-12-19 DIAGNOSIS — I5022 Chronic systolic (congestive) heart failure: Secondary | ICD-10-CM

## 2022-12-31 NOTE — Progress Notes (Signed)
Remote ICD transmission.   

## 2023-01-15 ENCOUNTER — Other Ambulatory Visit: Payer: Self-pay | Admitting: Cardiovascular Disease

## 2023-01-25 ENCOUNTER — Other Ambulatory Visit: Payer: Self-pay | Admitting: Cardiovascular Disease

## 2023-01-30 ENCOUNTER — Other Ambulatory Visit: Payer: Self-pay | Admitting: Cardiovascular Disease

## 2023-01-30 DIAGNOSIS — I5022 Chronic systolic (congestive) heart failure: Secondary | ICD-10-CM

## 2023-02-11 ENCOUNTER — Other Ambulatory Visit: Payer: Self-pay | Admitting: Nurse Practitioner

## 2023-02-25 ENCOUNTER — Ambulatory Visit: Payer: BLUE CROSS/BLUE SHIELD | Admitting: Cardiovascular Disease

## 2023-03-03 ENCOUNTER — Ambulatory Visit (INDEPENDENT_AMBULATORY_CARE_PROVIDER_SITE_OTHER): Payer: BLUE CROSS/BLUE SHIELD

## 2023-03-03 DIAGNOSIS — I255 Ischemic cardiomyopathy: Secondary | ICD-10-CM | POA: Diagnosis not present

## 2023-03-03 LAB — CUP PACEART REMOTE DEVICE CHECK
Battery Remaining Longevity: 4 mo
Battery Remaining Percentage: 3 %
Battery Voltage: 2.6 V
Brady Statistic RV Percent Paced: 1 %
Date Time Interrogation Session: 20240402041758
HighPow Impedance: 55 Ohm
Implantable Lead Connection Status: 753985
Implantable Lead Implant Date: 20120907
Implantable Lead Location: 753860
Implantable Pulse Generator Implant Date: 20120907
Lead Channel Impedance Value: 460 Ohm
Lead Channel Pacing Threshold Amplitude: 0.75 V
Lead Channel Pacing Threshold Pulse Width: 0.5 ms
Lead Channel Sensing Intrinsic Amplitude: 12 mV
Lead Channel Setting Pacing Amplitude: 2.5 V
Lead Channel Setting Pacing Pulse Width: 0.5 ms
Lead Channel Setting Sensing Sensitivity: 0.5 mV
Pulse Gen Serial Number: 1027081

## 2023-03-12 NOTE — Progress Notes (Signed)
Cardiology Office Note:    Date:  03/13/2023   ID:  Roy Turner, DOB 05/15/1971, MRN [id number]  PCP:  Aviva Kluver   [facility] Providers Cardiologist:  [name], MD     Referring MD: No ref. provider found   CC: follow up for CAD  History of Present Illness:    Roy Turner is a 52 y.o. male with a hx of CAD s/p anterior MI, ICM, H/O cardiac arrest with cardiac defibrillator in place, chronic systolic heart failure, hyperlipidemia.  In August 2012 he presented with anterior STEMI, complicated by cardiogenic shock and V-fib arrest.  LHC revealed total acute occlusion of the LAD as well as the RCA with severe left circumflex stenosis.  Intervention was performed but per report this was aborted due to the dissection.  He subsequently underwent CABG x 3 on July 28, 2011.  2 days later he suffered a V-fib arrest in the setting of hypokalemia.  Repeat LHC showed patent grafts with coronary disease distal to graft insertions.  He later underwent ICD placement and has been managed for ICM and HFrEF since.  Repeat diagnostic cath in January 2019 in the setting of angina showed patent vein graft to the LAD, patent LIMA to the OM 2, occlusion of the vein graft to the RCA and native multivessel disease.  Most recently he was evaluated by [name], NP on 08/25/2022, at this time he was doing relatively well from a cardiac perspective.  He was working out at [employer] most days of the week and was trying to increase his endurance.  He did report some mild chest pain at the end of his workouts however this had been persistent for some time.  Further GDMT had been limited by relatively soft blood pressure readings and chronic orthostasis.  He presents today for follow-up of his CAD.  He reports he has been doing well from a cardiac perspective.  He did suffer a fall around December 2023, feels like he may have broken a rib or 2, fall occurred after he tripped over a pile of laundry in  the middle of the night.  Denies dizziness, loss of consciousness.  He has had significant pain since this and has found that working out has been challenging for him.  He has recently started to walk again, and is trying to increase his endurance.  He endorses mild pedal edema, right greater than left, which is his harvest site. He denies chest pain, palpitations, dyspnea, pnd, orthopnea, n, v, dizziness, syncope, weight gain, or early satiety.    Past Medical History:  Diagnosis Date   CAD (coronary artery disease)    a. 07/2011 Anterior STEMI w/ CGS/Cath: LAD 100, LCX 80, RCA 100-->PCI aborted 2/2 dissection-->CABG x 3 (VG->LAD, Free LIMA->OM2, VG->RCA); b. 07/30/2011 Emergent Cath 2/2 VF arrest: patent grafts w/ CAD distal to graft touchdown; c. 03/2014 Neg MV, EF 29%; d. 12/2017 Cath: LM nl, LAD 90ost/p, 100p/m, D1 90, LCX 60, RCA 80p, 21m, 100d ISR. VG->LAD ok, LIMA->OM2 ok, VG->RCA 100 w/ L->R collats->Med Rx   H/O cardiac arrest    a. 07/2011 VF arrest 2 days following CABG.   HFrEF (heart failure with reduced ejection fraction)    a. 03/2016 Echo: EF 25-30%, antsept, ant, antlat, apical AK. Mildly dil LA.   Hyperlipidemia    Ischemic cardiomyopathy    a. 08/2011 s/p SJM [id number] Fortify ST VR single lead AICD; b. 03/2016 Echo: EF 25-30%.    Past Surgical History:  Procedure Laterality Date   CARDIAC CATHETERIZATION     [place]   CARDIAC DEFIBRILLATOR PLACEMENT  08/08/11   SJM Fortify ST VR implanted by Dr Selena Batten at [facility]   CORONARY ARTERY BYPASS GRAFT  07/28/11   LIMA to OM, SVG to LAD, SVGF to PDA   LEFT HEART CATH AND CORS/GRAFTS ANGIOGRAPHY Left 12/14/2017   Procedure: LEFT HEART CATH AND CORS/GRAFTS ANGIOGRAPHY;  Surgeon: [name], MD;  Location: [facility] INVASIVE CV LAB;  Service: Cardiovascular;  Laterality: Left;    Current Medications: Current Meds  Medication Sig   aspirin 81 MG tablet Take 81 mg by mouth daily.   carvedilol (COREG) 6.25 MG tablet TAKE  1 TABLET BY MOUTH TWICE A DAY   ENTRESTO 24-26 MG TAKE 1 TABLET BY MOUTH TWICE A DAY   eplerenone (INSPRA) 25 MG tablet TAKE 1 TABLET BY MOUTH EVERY DAY   ezetimibe (ZETIA) 10 MG tablet TAKE 1 TABLET BY MOUTH EVERY DAY   icosapent Ethyl (VASCEPA) 1 g capsule TAKE 2 CAPSULES BY MOUTH TWICE A DAY   KLOR-CON M20 20 MEQ tablet TAKE 1 TABLET BY MOUTH EVERY DAY   magnesium oxide (MAG-OX) 400 MG tablet Take 1 tablet (400 mg total) by mouth 2 (two) times daily.   Multiple Vitamin (MULTIVITAMIN) capsule Take 1 capsule by mouth daily.   nitroGLYCERIN (NITROSTAT) 0.4 MG SL tablet Place 1 tablet (0.4 mg total) under the tongue every 5 (five) minutes as needed for chest pain.   pantoprazole (PROTONIX) 40 MG tablet TAKE 1 TABLET BY MOUTH EVERY DAY   rosuvastatin (CRESTOR) 40 MG tablet TAKE 1 TABLET BY MOUTH EVERY DAY   torsemide (DEMADEX) 20 MG tablet TAKE TWO TABLETS (40MG ) DAILY IN THE MORNING AND ONE TABLET (20MG ) IN THE AFTERNOON AS-NEEDED.     Allergies:   Penicillins   Social History   Socioeconomic History   Marital status: Married    Spouse name: Not on file   Number of children: Not on file   Years of education: Not on file   Highest education level: Not on file  Occupational History   Not on file  Tobacco Use   Smoking status: Former    Packs/day: 1.00    Years: 15.00    Additional pack years: 0.00    Total pack years: 15.00    Types: Cigarettes    Quit date: 07/02/2011    Years since quitting: 11.7   Smokeless tobacco: Never  Vaping Use   Vaping Use: Former  Substance and Sexual Activity   Alcohol use: Not Currently    Comment: occasional   Drug use: No   Sexual activity: Not on file  Other Topics Concern   Not on file  Social History Narrative   Recently moved to [facility] for his wife's job.  He is currently unemployed.   Social Determinants of Health   Financial Resource Strain: Not on file  Food Insecurity: Not on file  Transportation Needs: Not on file  Physical  Activity: Not on file  Stress: Not on file  Social Connections: Not on file     Family History: The patient's family history includes Cancer in his father.  ROS:   Please see the history of present illness.    All other systems reviewed and are negative.  EKGs/Labs/Other Studies Reviewed:    The following studies were reviewed today:  Cardiac Studies & Procedures   CARDIAC CATHETERIZATION  CARDIAC CATHETERIZATION 12/14/2017  Narrative  Ost LAD to  Prox LAD lesion is 90% stenosed.  Ost 1st Diag to 1st Diag lesion is 90% stenosed.  Prox LAD to Mid LAD lesion is 100% stenosed.  Prox Cx to Mid Cx lesion is 60% stenosed.  Dist RCA lesion is 100% stenosed.  Prox RCA lesion is 80% stenosed.  Mid RCA lesion is 90% stenosed.  SVG graft was visualized by angiography and is normal in caliber.  The graft exhibits no disease.  SVG graft was visualized by angiography.  Origin lesion is 100% stenosed.  LIMA graft was visualized by angiography and is normal in caliber.  The graft exhibits no disease.  There is competitive flow.  LV end diastolic pressure is normal.  1.  Significant underlying three-vessel coronary artery disease with patent LIMA to OM 2 and SVG to LAD.  Occluded SVG to RCA at the ostium with chronically occluded native right coronary artery.  Right to right and left-to-right collaterals are noted. 2.  Normal left ventricular end-diastolic pressure.  Left ventricular angiography was performed but the catheter came out of the LV to the ascending aorta during injection and thus was not diagnostic.  Recommendations: Medical therapy for coronary artery disease.  Ranexa can be considered if the patient has refractory angina.  Findings Coronary Findings Diagnostic  Dominance: Right  Left Main Vessel is angiographically normal.  Left Anterior Descending Ost LAD to Prox LAD lesion is 90% stenosed. Prox LAD to Mid LAD lesion is 100% stenosed.  First Diagonal  Branch Ost 1st Diag to 1st Diag lesion is 90% stenosed.  Left Circumflex Prox Cx to Mid Cx lesion is 60% stenosed.  First Obtuse Marginal Branch Vessel is small in size. There is mild disease in the vessel.  Second Obtuse Marginal Branch Vessel is large in size.  Third Obtuse Marginal Branch There is mild disease in the vessel.  Right Coronary Artery Prox RCA lesion is 80% stenosed. Mid RCA lesion is 90% stenosed. Dist RCA lesion is 100% stenosed. The lesion is type C. The lesion was previously treated using a stent (unknown type) over 2 years ago.  Right Posterior Atrioventricular Artery Collaterals RPAV filled by collaterals from Acute Mrg.  Collaterals RPAV filled by collaterals from 3rd Sept.  Collaterals RPAV filled by collaterals from Dist Cx.  Saphenous Graft To Mid LAD SVG graft was visualized by angiography and is normal in caliber. The graft exhibits no disease.  Saphenous Graft To Dist RCA SVG graft was visualized by angiography. Origin lesion is 100% stenosed.  LIMA LIMA Graft To 2nd Mrg LIMA graft was visualized by angiography and is normal in caliber. The graft exhibits no disease. There is competitive flow.  Intervention  No interventions have been documented.     ECHOCARDIOGRAM  ECHOCARDIOGRAM COMPLETE 03/20/2016  Narrative *[facility]* [address] [place], [place] [address] [phone]  ------------------------------------------------------------------- Transthoracic Echocardiography  Patient:    Yichen, Gilardi MR #:       [id number] Study Date: 03/20/2016 Gender:     M Age:        45 Height:     180.3 cm Weight:     115.2 kg BSA:        2.44 m^2 Pt. Status: Room:  ATTENDING    Default, Provider [phone] Lessie Dings, MD REFERRING    [name], MD PERFORMING   Niland, Funk SONOGRAPHER  Quentin Ore, RVT, RDCS,  RDMS  cc:  ------------------------------------------------------------------- LV EF: 25% -   30%  -------------------------------------------------------------------  History:   PMH:   Lightheadedness and generalized edema.  Coronary artery disease.  Congestive heart failure.  Risk factors: Dyslipidemia.  ------------------------------------------------------------------- Study Conclusions  - Left ventricle: The cavity size was mildly dilated. Wall thickness was normal. Systolic function was severely reduced. The estimated ejection fraction was in the range of 25% to 30%. Akinesis of the anteroseptal, anterior, anterolateral, and apical myocardium. Left ventricular diastolic function parameters were normal. - Left atrium: The atrium was mildly dilated.  Transthoracic echocardiography.  M-mode, complete 2D, spectral Doppler, and color Doppler.  Birthdate:  Patient birthdate: 10-23-1971.  Age:  Patient is 52 yr old.  Sex:  Gender: male. BMI: 35.4 kg/m^2.  Blood pressure:     102/60  Patient status: Outpatient.  Study date:  Study date: 03/20/2016. Study time: 11:32 AM.  -------------------------------------------------------------------  ------------------------------------------------------------------- Left ventricle:  The cavity size was mildly dilated. Wall thickness was normal. Systolic function was severely reduced. The estimated ejection fraction was in the range of 25% to 30%.  Regional wall motion abnormalities:   Akinesis of the anteroseptal, anterior, anterolateral, and apical myocardium. The transmitral flow pattern was normal. The deceleration time of the early transmitral flow velocity was normal. The pulmonary vein flow pattern was normal. The tissue Doppler parameters were normal. Left ventricular diastolic function parameters were normal.  ------------------------------------------------------------------- Aortic valve:   Trileaflet; normal thickness  leaflets. Mobility was not restricted.  Doppler:  Transvalvular velocity was within the normal range. There was no stenosis. There was no regurgitation.  ------------------------------------------------------------------- Aorta:  Aortic root: The aortic root was normal in size.  ------------------------------------------------------------------- Mitral valve:   Structurally normal valve.   Mobility was not restricted.  Doppler:  Transvalvular velocity was within the normal range. There was no evidence for stenosis. There was no regurgitation.  ------------------------------------------------------------------- Left atrium:  The atrium was mildly dilated.  ------------------------------------------------------------------- Right ventricle:  The cavity size was normal. Wall thickness was normal. Pacer wire or catheter noted in right ventricle. Systolic function was normal.  ------------------------------------------------------------------- Pulmonic valve:    Doppler:  Transvalvular velocity was within the normal range. There was no evidence for stenosis.  ------------------------------------------------------------------- Tricuspid valve:   Structurally normal valve.    Doppler: Transvalvular velocity was within the normal range. There was no regurgitation.  ------------------------------------------------------------------- Pulmonary artery:   The main pulmonary artery was normal-sized. Systolic pressure could not be accurately estimated.  ------------------------------------------------------------------- Right atrium:  The atrium was normal in size.  ------------------------------------------------------------------- Pericardium:  There was no pericardial effusion.  ------------------------------------------------------------------- Systemic veins: Inferior vena cava: The vessel was normal in  size.  ------------------------------------------------------------------- Measurements  Left ventricle                          Value        Reference LV ID, ED, PLAX chordal         (H)     56    mm     43 - 52 LV ID, ES, PLAX chordal         (H)     49.5  mm     23 - 38 LV fx shortening, PLAX chordal  (L)     12    %      >=29 LV PW thickness, ED                     9.04  mm     --------- IVS/LV PW ratio, ED  0.95         <=1.3 Stroke volume, 2D                       52    ml     --------- Stroke volume/bsa, 2D                   21    ml/m^2 --------- LV ejection fraction, 1-p A4C           25    %      --------- LV e&', lateral                          10.8  cm/s   --------- LV E/e&', lateral                        6.29         --------- LV e&', medial                           6.09  cm/s   --------- LV E/e&', medial                         11.15        --------- LV e&', average                          8.45  cm/s   --------- LV E/e&', average                        8.04         ---------  Ventricular septum                      Value        Reference IVS thickness, ED                       8.59  mm     ---------  LVOT                                    Value        Reference LVOT ID, S                              20    mm     --------- LVOT area                               3.14  cm^2   --------- LVOT ID                                 20    mm     --------- LVOT peak velocity, S                   87.9  cm/s   --------- LVOT mean velocity, S                   50.2  cm/s   ---------  LVOT VTI, S                             16.7  cm     --------- Stroke volume (SV), LVOT DP             52.5  ml     --------- Stroke index (SV/bsa), LVOT DP          21.5  ml/m^2 ---------  Aorta                                   Value        Reference Aortic root ID, ED                      32    mm     --------- Ascending aorta ID, A-P, S              27    mm      ---------  Left atrium                             Value        Reference LA ID, A-P, ES                          41    mm     --------- LA ID/bsa, A-P                          1.68  cm/m^2 <=2.2 LA volume, S                            48.9  ml     --------- LA volume/bsa, S                        20    ml/m^2 --------- LA volume, ES, 1-p A4C                  41.3  ml     --------- LA volume/bsa, ES, 1-p A4C              16.9  ml/m^2 --------- LA volume, ES, 1-p A2C                  57.6  ml     --------- LA volume/bsa, ES, 1-p A2C              23.6  ml/m^2 ---------  Mitral valve                            Value        Reference Mitral E-wave peak velocity             67.9  cm/s   --------- Mitral A-wave peak velocity             58.7  cm/s   --------- Mitral deceleration time        (H)     250   ms     150 - 230 Mitral E/A ratio, peak  1.2          ---------  Right ventricle                         Value        Reference TAPSE                                   15.6  mm     ---------  Pulmonic valve                          Value        Reference Pulmonic valve peak velocity, S         73.7  cm/s   ---------  Legend: (L)  and  (H)  mark values outside specified reference range.  ------------------------------------------------------------------- Prepared and Electronically Authenticated by  [name], MD 2017-04-20T17:13:26              EKG:  EKG is  ordered today.  The ekg ordered today demonstrates NSR, HR 70 bpm, consistent with prior EKG tracings.  Recent Labs: 08/25/2022: BUN 15; Creatinine, Ser 1.21; Potassium 4.0; Sodium 141  Recent Lipid Panel    Component Value Date/Time   CHOL 145 02/28/2022 1639   CHOL 126 08/29/2021 1146   TRIG 218 (H) 02/28/2022 1639   HDL 41 02/28/2022 1639   HDL 37 (L) 08/29/2021 1146   CHOLHDL 3.5 02/28/2022 1639   VLDL 44 (H) 02/28/2022 1639   LDLCALC 60 02/28/2022 1639   LDLCALC 48 08/29/2021 1146     Risk  Assessment/Calculations:                Physical Exam:    VS:  BP 110/70 (BP Location: Left Arm, Patient Position: Sitting, Cuff Size: Large)   Pulse 70   Ht 6' (1.829 m)   Wt 236 lb 3.2 oz (107.1 kg)   SpO2 95%   BMI 32.03 kg/m     Wt Readings from Last 3 Encounters:  03/13/23 236 lb 3.2 oz (107.1 kg)  08/25/22 242 lb (109.8 kg)  03/11/22 250 lb (113.4 kg)     GEN:  Well nourished, well developed in no acute distress HEENT: Normal NECK: No JVD; No carotid bruits LYMPHATICS: No lymphadenopathy CARDIAC: RRR, no murmurs, rubs, gallops RESPIRATORY:  Clear to auscultation without rales, wheezing or rhonchi  ABDOMEN: Soft, non-tender, non-distended MUSCULOSKELETAL:  trace pedal edema R > L; No deformity  SKIN: Warm and dry NEUROLOGIC:  Alert and oriented x 3 PSYCHIATRIC:  Normal affect   ASSESSMENT:    1. Coronary artery disease of native artery of native heart with stable angina pectoris   2. Chronic HFrEF (heart failure with reduced ejection fraction)   3. Cardiomyopathy, ischemic   4. Cardiac defibrillator in situ   5. Hyperlipidemia, unspecified hyperlipidemia type    PLAN:    In order of problems listed above:  CAD - s/p anterior MI, ICM, H/O cardiac arrest with cardiac defibrillator in place. Stable with no anginal symptoms. No indication for ischemic evaluation.  Heart healthy diet and regular cardiovascular exercise encouraged.  Continue aspirin and 1 mg daily, continue Coreg 6.25 mg twice a day, continue Zetia 10 mg daily, continue Crestor 40 mg daily.  Has as needed NTG however has not needed to use.  Recent BMET in September showed stable kidney function.  HFrEF-echo in 2017 revealed an EF of 25 to 30%, NYHA class I today, euvolemic.  Continue Entresto 24-26 mg twice daily, continue carvedilol 6.25 mg twice daily, continue eplerenone 25 mg daily.  Consider addition of SGLT2 in the future however as blood pressure has been marginally low in the past.   ICM,  defibrillator in place - St Jude ICD implanted 2010 for secondary prevention, follows with EP.   Hyperlipidemia -most recent LDL was 60 on 01/29/2022.  Continue Crestor 40 mg daily, continue Zetia 10 mg daily.  Need to update his fasting lipid panel and liver function test however patient had declined today as he had another appointment to get to.  Offered to send him lab slips to get checked at a location close to his home however he would prefer to wait till he returns at his next visit.  Disposition-return in 6 months, will need updated CMET, FLP at this visit.          Medication Adjustments/Labs and Tests Ordered: Current medicines are reviewed at length with the patient today.  Concerns regarding medicines are outlined above.  Orders Placed This Encounter  Procedures   CBC   Comp Met (CMET)   Lipid panel   No orders of the defined types were placed in this encounter.   Patient Instructions  Please have labs below done before next appointment.   Medication Instructions:  No changes at this time.   *If you need a refill on your cardiac medications before your next appointment, please call your pharmacy*   Lab Work: CBC, CMET, and fasting lipid panel. So nothing to eat or drink after midnight except sip of water with your medications. No appointment is needed and just go to following location:   Medical Mall Entrance at [facility] 1st desk on the right to check in (REGISTRATION)  Lab hours: Monday- Friday (7:30 am- 5:30 pm)  If you have labs (blood work) drawn today and your tests are completely normal, you will receive your results only by: MyChart Message (if you have MyChart) OR A paper copy in the mail If you have any lab test that is abnormal or we need to change your treatment, we will call you to review the results.   Testing/Procedures: None   Follow-Up: At [facility], you and your health needs are our priority.  As part of our continuing mission to  provide you with exceptional heart care, we have created designated Provider Care Teams.  These Care Teams include your primary Cardiologist (physician) and Advanced Practice Providers (APPs -  Physician Assistants and Nurse Practitioners) who all work together to provide you with the care you need, when you need it.  Your next appointment:   6 month(s)  Provider:   [name], MD or [name], PA-C       Signed, Flossie Dibble, NP  03/13/2023 2:49 PM    [facility]

## 2023-03-13 ENCOUNTER — Encounter: Payer: Self-pay | Admitting: Physician Assistant

## 2023-03-13 ENCOUNTER — Ambulatory Visit: Payer: BLUE CROSS/BLUE SHIELD | Attending: Cardiovascular Disease | Admitting: Cardiology

## 2023-03-13 VITALS — BP 110/70 | HR 70 | Ht 72.0 in | Wt 236.2 lb

## 2023-03-13 DIAGNOSIS — I5022 Chronic systolic (congestive) heart failure: Secondary | ICD-10-CM | POA: Diagnosis not present

## 2023-03-13 DIAGNOSIS — I25118 Atherosclerotic heart disease of native coronary artery with other forms of angina pectoris: Secondary | ICD-10-CM | POA: Diagnosis not present

## 2023-03-13 DIAGNOSIS — E785 Hyperlipidemia, unspecified: Secondary | ICD-10-CM

## 2023-03-13 DIAGNOSIS — Z95810 Presence of automatic (implantable) cardiac defibrillator: Secondary | ICD-10-CM | POA: Diagnosis not present

## 2023-03-13 DIAGNOSIS — I255 Ischemic cardiomyopathy: Secondary | ICD-10-CM

## 2023-03-13 NOTE — Patient Instructions (Addendum)
Please have labs below done before next appointment.   Medication Instructions:  No changes at this time.   *If you need a refill on your cardiac medications before your next appointment, please call your pharmacy*   Lab Work: CBC, CMET, and fasting lipid panel. So nothing to eat or drink after midnight except sip of water with your medications. No appointment is needed and just go to following location:   Medical Mall Entrance at [facility] 1st desk on the right to check in (REGISTRATION)  Lab hours: Monday- Friday (7:30 am- 5:30 pm)  If you have labs (blood work) drawn today and your tests are completely normal, you will receive your results only by: MyChart Message (if you have MyChart) OR A paper copy in the mail If you have any lab test that is abnormal or we need to change your treatment, we will call you to review the results.   Testing/Procedures: None   Follow-Up: At [facility], you and your health needs are our priority.  As part of our continuing mission to provide you with exceptional heart care, we have created designated Provider Care Teams.  These Care Teams include your primary Cardiologist (physician) and Advanced Practice Providers (APPs -  Physician Assistants and Nurse Practitioners) who all work together to provide you with the care you need, when you need it.  Your next appointment:   6 month(s)  Provider:   [name], MD or [name], PA-C

## 2023-03-14 ENCOUNTER — Other Ambulatory Visit: Payer: Self-pay | Admitting: Nurse Practitioner

## 2023-03-15 ENCOUNTER — Other Ambulatory Visit: Payer: Self-pay | Admitting: Cardiovascular Disease

## 2023-03-15 DIAGNOSIS — I5022 Chronic systolic (congestive) heart failure: Secondary | ICD-10-CM

## 2023-03-17 NOTE — Addendum Note (Signed)
Addended by: Auburn Bilberry D on: 03/17/2023 09:05 AM   Modules accepted: Orders

## 2023-03-29 ENCOUNTER — Encounter: Payer: Self-pay | Admitting: Cardiovascular Disease

## 2023-04-02 ENCOUNTER — Ambulatory Visit: Payer: BLUE CROSS/BLUE SHIELD | Attending: Cardiology | Admitting: Cardiology

## 2023-04-02 ENCOUNTER — Encounter: Payer: Self-pay | Admitting: Cardiology

## 2023-04-02 ENCOUNTER — Other Ambulatory Visit
Admission: RE | Admit: 2023-04-02 | Discharge: 2023-04-02 | Disposition: A | Discharge status: Home / Self Care | Payer: BLUE CROSS/BLUE SHIELD | Attending: Cardiology | Admitting: Cardiology

## 2023-04-02 VITALS — BP 100/76 | HR 73 | Ht 72.0 in | Wt 238.0 lb

## 2023-04-02 DIAGNOSIS — Z95810 Presence of automatic (implantable) cardiac defibrillator: Secondary | ICD-10-CM | POA: Diagnosis present

## 2023-04-02 DIAGNOSIS — I5022 Chronic systolic (congestive) heart failure: Secondary | ICD-10-CM | POA: Insufficient documentation

## 2023-04-02 DIAGNOSIS — I255 Ischemic cardiomyopathy: Secondary | ICD-10-CM | POA: Diagnosis present

## 2023-04-02 DIAGNOSIS — I25118 Atherosclerotic heart disease of native coronary artery with other forms of angina pectoris: Secondary | ICD-10-CM | POA: Diagnosis present

## 2023-04-02 DIAGNOSIS — E785 Hyperlipidemia, unspecified: Secondary | ICD-10-CM

## 2023-04-02 LAB — CUP PACEART INCLINIC DEVICE CHECK
Battery Remaining Longevity: 0 mo
Brady Statistic RV Percent Paced: 0 %
Date Time Interrogation Session: 20240502163025
HighPow Impedance: 56.809
Implantable Lead Connection Status: 753985
Implantable Lead Implant Date: 20120907
Implantable Lead Location: 753860
Implantable Pulse Generator Implant Date: 20120907
Lead Channel Impedance Value: 450 Ohm
Lead Channel Pacing Threshold Amplitude: 0.75 V
Lead Channel Pacing Threshold Amplitude: 0.75 V
Lead Channel Pacing Threshold Pulse Width: 0.5 ms
Lead Channel Pacing Threshold Pulse Width: 0.5 ms
Lead Channel Sensing Intrinsic Amplitude: 12 mV
Lead Channel Setting Pacing Amplitude: 2.5 V
Lead Channel Setting Pacing Pulse Width: 0.5 ms
Lead Channel Setting Sensing Sensitivity: 0.5 mV
Pulse Gen Serial Number: 1027081

## 2023-04-02 LAB — COMPREHENSIVE METABOLIC PANEL WITH GFR
ALT: 22 U/L (ref 0–44)
AST: 21 U/L (ref 15–41)
Albumin: 4.6 g/dL (ref 3.5–5.0)
Alkaline Phosphatase: 67 U/L (ref 38–126)
Anion gap: 11 (ref 5–15)
BUN: 17 mg/dL (ref 6–20)
CO2: 26 mmol/L (ref 22–32)
Calcium: 9.2 mg/dL (ref 8.9–10.3)
Chloride: 101 mmol/L (ref 98–111)
Creatinine, Ser: 1.11 mg/dL (ref 0.61–1.24)
GFR, Estimated: >60 mL/min
Glucose, Bld: 113 mg/dL — ABNORMAL HIGH (ref 70–99)
Potassium: 3.7 mmol/L (ref 3.5–5.1)
Sodium: 138 mmol/L (ref 135–145)
Total Bilirubin: 0.8 mg/dL (ref 0.3–1.2)
Total Protein: 7.5 g/dL (ref 6.5–8.1)

## 2023-04-02 LAB — CBC
HCT: 42 % (ref 39.0–52.0)
Hemoglobin: 14.3 g/dL (ref 13.0–17.0)
MCH: 30.1 pg (ref 26.0–34.0)
MCHC: 34 g/dL (ref 30.0–36.0)
MCV: 88.4 fL (ref 80.0–100.0)
Platelets: 249 K/uL (ref 150–400)
RBC: 4.75 MIL/uL (ref 4.22–5.81)
RDW: 13.2 % (ref 11.5–15.5)
WBC: 9.4 K/uL (ref 4.0–10.5)
nRBC: 0 % (ref 0.0–0.2)

## 2023-04-02 LAB — LIPID PANEL
Cholesterol: 136 mg/dL (ref 0–200)
HDL: 41 mg/dL
LDL Cholesterol: 52 mg/dL (ref 0–99)
Total CHOL/HDL Ratio: 3.3 ratio
Triglycerides: 213 mg/dL — ABNORMAL HIGH
VLDL: 43 mg/dL — ABNORMAL HIGH (ref 0–40)

## 2023-04-02 MED ORDER — CARVEDILOL 3.125 MG PO TABS: 3.1250 mg | ORAL_TABLET | Freq: Two times a day (BID) | ORAL | 3 refills | Status: DC

## 2023-04-02 NOTE — Progress Notes (Signed)
Cardiology Office Note Date:  04/02/2023  Patient ID:  Roy Turner, DOB 08-Jan-1971, MRN [id number] PCP:  Pcp, No  Cardiologist:  [name], MD Electrophysiologist: Sherryl Manges, MD    Chief Complaint: 1 year follow-up, device at ERI  History of Present Illness: Roy Turner is a 52 y.o. male with PMH notable for ICM, HFrEF, s/p ICD, CAD s/p PCI with coronary dissection, CABG complicated by VF arrest post-op; seen today for Sherryl Manges, MD for routine electrophysiology followup.  He last saw Dr. Graciela Husbands 03/2022, was doing well.  He saw Np Elliot Gurney 03/2023, having chronic R>L pedal edema. No med changes He notified clinic late last month that his device vibrated indicating ERI.   Since last being seen in clinic, patient confirms that he feels relatively well. He does have orthostasis symptoms every time he bends over to do simple tasks.  He does not take BP at home regularly, when he does readings are frequently 90-100s/50s  Continues to have lower extremity edema, takes torsemide daily to keep under control. Has good UOP after torsemide dose every morning.     he denies chest pain, palpitations, dyspnea, PND, orthopnea, nausea, vomiting, dizziness, syncope, or early satiety.     Device Information: MDT single chamber ICD, imp 08/2011; ICM, HFrEF   Past Medical History:  Diagnosis Date   CAD (coronary artery disease)    a. 07/2011 Anterior STEMI w/ CGS/Cath: LAD 100, LCX 80, RCA 100-->PCI aborted 2/2 dissection-->CABG x 3 (VG->LAD, Free LIMA->OM2, VG->RCA); b. 07/30/2011 Emergent Cath 2/2 VF arrest: patent grafts w/ CAD distal to graft touchdown; c. 03/2014 Neg MV, EF 29%; d. 12/2017 Cath: LM nl, LAD 90ost/p, 100p/m, D1 90, LCX 60, RCA 80p, 67m, 100d ISR. VG->LAD ok, LIMA->OM2 ok, VG->RCA 100 w/ L->R collats->Med Rx   H/O cardiac arrest    a. 07/2011 VF arrest 2 days following CABG.   HFrEF (heart failure with reduced ejection fraction) (HCC)    a. 03/2016 Echo: EF 25-30%, antsept, ant,  antlat, apical AK. Mildly dil LA.   Hyperlipidemia    Ischemic cardiomyopathy    a. 08/2011 s/p SJM [id number] Fortify ST VR single lead AICD; b. 03/2016 Echo: EF 25-30%.    Past Surgical History:  Procedure Laterality Date   CARDIAC CATHETERIZATION     [place]   CARDIAC DEFIBRILLATOR PLACEMENT  08/08/11   SJM Fortify ST VR implanted by Dr Selena Batten [facility]   CORONARY ARTERY BYPASS GRAFT  07/28/11   LIMA to OM, SVG to LAD, SVGF to PDA   LEFT HEART CATH AND CORS/GRAFTS ANGIOGRAPHY Left 12/14/2017   Procedure: LEFT HEART CATH AND CORS/GRAFTS ANGIOGRAPHY;  Surgeon: [name], MD;  Location: [facility] INVASIVE CV LAB;  Service: Cardiovascular;  Laterality: Left;    Current Outpatient Medications  Medication Instructions   aspirin 81 mg, Oral, Daily   carvedilol (COREG) 6.25 MG tablet TAKE 1 TABLET BY MOUTH TWICE A DAY   ENTRESTO 24-26 MG TAKE 1 TABLET BY MOUTH TWICE A DAY   eplerenone (INSPRA) 25 MG tablet TAKE 1 TABLET BY MOUTH EVERY DAY   ezetimibe (ZETIA) 10 MG tablet TAKE 1 TABLET BY MOUTH EVERY DAY   KLOR-CON M20 20 MEQ tablet TAKE 1 TABLET BY MOUTH EVERY DAY   magnesium oxide (MAG-OX) 400 mg, Oral, 2 times daily   Multiple Vitamin (MULTIVITAMIN) capsule 1 capsule, Oral, Daily   nitroGLYCERIN (NITROSTAT) 0.4 mg, Sublingual, Every 5 min PRN   pantoprazole (PROTONIX) 40 MG tablet TAKE 1 TABLET  BY MOUTH EVERY DAY   rosuvastatin (CRESTOR) 40 MG tablet TAKE 1 TABLET BY MOUTH EVERY DAY   torsemide (DEMADEX) 20 MG tablet TAKE 2 TABLET ONCE DAILY IN THE MORNING AND 1 TABLET IN THE AFTERNOON AS NEEDED   Vascepa 2 g, Oral, 2 times daily    Social History:  The patient  reports that he quit smoking about 11 years ago. His smoking use included cigarettes. He has a 15.00 pack-year smoking history. He has never used smokeless tobacco. He reports that he does not currently use alcohol. He reports that he does not use drugs.   Family History:  The patient's family history  includes Cancer in his father.  ROS:  Please see the history of present illness. All other systems are reviewed and otherwise negative.   PHYSICAL EXAM:  VS:  BP 100/76 (BP Location: Left Arm, Patient Position: Sitting, Cuff Size: Large)   Pulse 73   Ht 6' (1.829 m)   Wt 238 lb (108 kg)   SpO2 97%   BMI 32.28 kg/m  BMI: Body mass index is 32.28 kg/m.  GEN- The patient is well appearing, alert and oriented x 3 today.   Lungs- Clear to ausculation bilaterally, normal work of breathing.  Heart- Regular rate and rhythm, no murmurs, rubs or gallops Extremities- 1+ peripheral edema R>L, warm, dry Skin-   device pocket well-healed, no tethering   Device interrogation done today and reviewed by myself:  Battery at ERI Lead thresholds, impedence, sensing stable  Rare V-pacing No episodes No changes made today  EKG is ordered. Personal review of EKG from today shows:  NSR, rate 73bpm;   Recent Labs: 04/02/2023: ALT 22; BUN 17; Creatinine, Ser 1.11; Hemoglobin 14.3; Platelets 249; Potassium 3.7; Sodium 138  04/02/2023: Cholesterol 136; HDL 41; LDL Cholesterol 52; Total CHOL/HDL Ratio 3.3; Triglycerides 213; VLDL 43   Estimated Creatinine Clearance: 100 mL/min (by C-G formula based on SCr of 1.11 mg/dL).   Wt Readings from Last 3 Encounters:  04/02/23 238 lb (108 kg)  03/13/23 236 lb 3.2 oz (107.1 kg)  08/25/22 242 lb (109.8 kg)     Additional studies reviewed include: Previous EP, cardiology notes.    LHC, 12/14/2017 Ost LAD to Prox LAD lesion is 90% stenosed. Ost 1st Diag to 1st Diag lesion is 90% stenosed. Prox LAD to Mid LAD lesion is 100% stenosed. Prox Cx to Mid Cx lesion is 60% stenosed. Dist RCA lesion is 100% stenosed. Prox RCA lesion is 80% stenosed. Mid RCA lesion is 90% stenosed. SVG graft was visualized by angiography and is normal in caliber. The graft exhibits no disease. SVG graft was visualized by angiography. Origin lesion is 100% stenosed. LIMA graft was  visualized by angiography and is normal in caliber. The graft exhibits no disease. There is competitive flow. LV end diastolic pressure is normal. 1.  Significant underlying three-vessel coronary artery disease with patent LIMA to OM 2 and SVG to LAD.  Occluded SVG to RCA at the ostium with chronically occluded native right coronary artery.  Right to right and left-to-right collaterals are noted. 2.  Normal left ventricular end-diastolic pressure.  Left ventricular angiography was performed but the catheter came out of the LV to the ascending aorta during injection and thus was not diagnostic.  TTE, 03/20/2016 - Left ventricle: The cavity size was mildly dilated. Wall thickness was normal. Systolic function was severely reduced. The estimated ejection fraction was in the range of 25% to 30%. Akinesis of the anteroseptal,  anterior, anterolateral, and apical myocardium. Left ventricular diastolic function parameters were normal.  - Left atrium: The atrium was mildly dilated.    ASSESSMENT AND PLAN:  #) HFrEF NYHA class II-III symptoms Warm on exam, lungs clear.  GDMT: coreg, entresto, eplerenone Diuretic: torsemide 40am/20pm Will lower coreg to 3.125 BID to see if helps with orthostasis symptoms.    #) s/p ICD ERI reached 03/29/23 Discussed generator change procedure with patient. He verbalized understanding and wished to proceed. Reviewed risks/benefits. Patient requests to have procedure done June 10 or after d/t wife having surgery at the end of May     Current medicines are reviewed at length with the patient today.   The patient does not have concerns regarding his medicines.  The following changes were made today:   DECREASE coreg to 3.125mg  BID  Labs/ tests ordered today include:  Orders Placed This Encounter  Procedures   Basic Metabolic Panel (BMET)   CBC   EKG 12-Lead     Disposition: Follow up as usual post-procedure   Signed, Sherie Don, NP  04/02/23  3:37  PM  Electrophysiology [facility]

## 2023-04-02 NOTE — Patient Instructions (Addendum)
Medication Instructions:  Your physician recommends that you continue on your current medications as directed. Please refer to the Current Medication list given to you today.  *If you need a refill on your cardiac medications before your next appointment, please call your pharmacy*  Lab Work: BMP and CBC to be drawn within 30 days of procedure - Please go to the [facility]. You will check in at the front desk to the right as you walk into the atrium. Valet Parking is offered if needed. - No appointment needed. You may go any day between 7 am and 6 pm.  If you have labs (blood work) drawn today and your tests are completely normal, you will receive your results only by: MyChart Message (if you have MyChart) OR A paper copy in the mail If you have any lab test that is abnormal or we need to change your treatment, we will call you to review the results.   Testing/Procedures: No testing ordered   Follow-Up: At [facility], you and your health needs are our priority.  As part of our continuing mission to provide you with exceptional heart care, we have created designated Provider Care Teams.  These Care Teams include your primary Cardiologist (physician) and Advanced Practice Providers (APPs -  Physician Assistants and Nurse Practitioners) who all work together to provide you with the care you need, when you need it.  We recommend signing up for the patient portal called "MyChart".  Sign up information is provided on this After Visit Summary.  MyChart is used to connect with patients for Virtual Visits (Telemedicine).  Patients are able to view lab/test results, encounter notes, upcoming appointments, etc.  Non-urgent messages can be sent to your provider as well.   To learn more about what you can do with MyChart, go to [email or web address].    Your next appointment:   To be determined after generator battery change  Provider:   Sherryl Manges, MD or Sherie Don, NP

## 2023-04-06 ENCOUNTER — Ambulatory Visit (INDEPENDENT_AMBULATORY_CARE_PROVIDER_SITE_OTHER): Payer: BLUE CROSS/BLUE SHIELD

## 2023-04-06 DIAGNOSIS — I5022 Chronic systolic (congestive) heart failure: Secondary | ICD-10-CM

## 2023-04-07 LAB — CUP PACEART REMOTE DEVICE CHECK
Battery Remaining Longevity: 0 mo
Battery Voltage: 2.59 V
Brady Statistic RV Percent Paced: 0 %
Date Time Interrogation Session: 20240506020021
HighPow Impedance: 48 Ohm
Implantable Lead Connection Status: 753985
Implantable Lead Implant Date: 20120907
Implantable Lead Location: 753860
Implantable Pulse Generator Implant Date: 20120907
Lead Channel Impedance Value: 440 Ohm
Lead Channel Pacing Threshold Amplitude: 0.75 V
Lead Channel Pacing Threshold Pulse Width: 0.5 ms
Lead Channel Sensing Intrinsic Amplitude: 12 mV
Lead Channel Setting Pacing Amplitude: 2.5 V
Lead Channel Setting Pacing Pulse Width: 0.5 ms
Lead Channel Setting Sensing Sensitivity: 0.5 mV
Pulse Gen Serial Number: 1027081

## 2023-04-08 ENCOUNTER — Telehealth: Payer: Self-pay

## 2023-04-08 NOTE — Telephone Encounter (Signed)
Pt is scheduled for his ICD Gen change with Dr. Graciela Husbands at [facility] on 05/14/23 @ 7:30 am.   He will go to [facility] for labs the first week of June and will go by the office to get his scrub at that time - they have been notified of this.   Bryan Small with [facility] has been notified of this procedure date/time.

## 2023-04-14 NOTE — Progress Notes (Signed)
Remote ICD transmission.   

## 2023-04-30 ENCOUNTER — Other Ambulatory Visit: Payer: Self-pay | Admitting: Cardiovascular Disease

## 2023-05-04 ENCOUNTER — Encounter: Payer: Self-pay | Admitting: Internal Medicine

## 2023-05-04 NOTE — Addendum Note (Signed)
Addended by: Geralyn Flash D on: 05/04/2023 12:42 PM   Modules accepted: Level of Service

## 2023-05-04 NOTE — Progress Notes (Signed)
Remote ICD transmission.   

## 2023-05-06 ENCOUNTER — Other Ambulatory Visit
Admission: RE | Admit: 2023-05-06 | Discharge: 2023-05-06 | Disposition: A | Discharge status: Home / Self Care | Payer: BLUE CROSS/BLUE SHIELD | Source: Ambulatory Visit | Attending: Cardiology | Admitting: Cardiology

## 2023-05-06 DIAGNOSIS — I5022 Chronic systolic (congestive) heart failure: Secondary | ICD-10-CM | POA: Diagnosis present

## 2023-05-06 DIAGNOSIS — Z95810 Presence of automatic (implantable) cardiac defibrillator: Secondary | ICD-10-CM | POA: Insufficient documentation

## 2023-05-06 LAB — CBC
HCT: 41.2 % (ref 39.0–52.0)
Hemoglobin: 13.9 g/dL (ref 13.0–17.0)
MCH: 30.3 pg (ref 26.0–34.0)
MCHC: 33.7 g/dL (ref 30.0–36.0)
MCV: 90 fL (ref 80.0–100.0)
Platelets: 235 10*3/uL (ref 150–400)
RBC: 4.58 MIL/uL (ref 4.22–5.81)
RDW: 13.2 % (ref 11.5–15.5)
WBC: 8.5 10*3/uL (ref 4.0–10.5)
nRBC: 0 % (ref 0.0–0.2)

## 2023-05-06 LAB — BASIC METABOLIC PANEL
Anion gap: 10 (ref 5–15)
BUN: 15 mg/dL (ref 6–20)
CO2: 27 mmol/L (ref 22–32)
Calcium: 9.1 mg/dL (ref 8.9–10.3)
Chloride: 101 mmol/L (ref 98–111)
Creatinine, Ser: 1.07 mg/dL (ref 0.61–1.24)
GFR, Estimated: >60 mL/min (ref >60)
Glucose, Bld: 110 mg/dL — ABNORMAL HIGH (ref 70–99)
Potassium: 3.8 mmol/L (ref 3.5–5.1)
Sodium: 138 mmol/L (ref 135–145)

## 2023-05-09 ENCOUNTER — Other Ambulatory Visit: Payer: Self-pay | Admitting: Nurse Practitioner

## 2023-05-10 ENCOUNTER — Other Ambulatory Visit: Payer: Self-pay | Admitting: Cardiovascular Disease

## 2023-05-14 ENCOUNTER — Ambulatory Visit
Admission: RE | Admit: 2023-05-14 | Discharge: 2023-05-14 | Disposition: A | Discharge status: Home / Self Care | Payer: BLUE CROSS/BLUE SHIELD | Attending: Internal Medicine | Admitting: Internal Medicine

## 2023-05-14 ENCOUNTER — Encounter
Admission: RE | Disposition: A | Discharge status: Home / Self Care | Payer: Self-pay | Source: Home / Self Care | Attending: Internal Medicine

## 2023-05-14 ENCOUNTER — Encounter: Payer: Self-pay | Admitting: Internal Medicine

## 2023-05-14 ENCOUNTER — Other Ambulatory Visit: Payer: Self-pay

## 2023-05-14 DIAGNOSIS — Z4502 Encounter for adjustment and management of automatic implantable cardiac defibrillator: Secondary | ICD-10-CM | POA: Insufficient documentation

## 2023-05-14 DIAGNOSIS — I5022 Chronic systolic (congestive) heart failure: Secondary | ICD-10-CM | POA: Diagnosis not present

## 2023-05-14 DIAGNOSIS — Z8674 Personal history of sudden cardiac arrest: Secondary | ICD-10-CM | POA: Diagnosis not present

## 2023-05-14 DIAGNOSIS — Z6831 Body mass index (BMI) 31.0-31.9, adult: Secondary | ICD-10-CM | POA: Diagnosis not present

## 2023-05-14 DIAGNOSIS — Z95810 Presence of automatic (implantable) cardiac defibrillator: Secondary | ICD-10-CM

## 2023-05-14 DIAGNOSIS — I255 Ischemic cardiomyopathy: Secondary | ICD-10-CM | POA: Insufficient documentation

## 2023-05-14 HISTORY — PX: ICD GENERATOR CHANGEOUT: EP1231

## 2023-05-14 SURGERY — ICD GENERATOR CHANGEOUT
Anesthesia: Moderate Sedation

## 2023-05-14 MED ORDER — VANCOMYCIN HCL 1500 MG/300ML IV SOLN
1500.0000 mg | INTRAVENOUS | Status: AC
  Administered 2023-05-14: 1500 mg via INTRAVENOUS
  Filled 2023-05-14: qty 300

## 2023-05-14 MED ORDER — MIDAZOLAM HCL 2 MG/2ML IJ SOLN
INTRAMUSCULAR | Status: AC
  Filled 2023-05-14: qty 2

## 2023-05-14 MED ORDER — LIDOCAINE HCL (PF) 1 % IJ SOLN
INTRAMUSCULAR | Status: DC | PRN
  Administered 2023-05-14: 40 mL via SUBCUTANEOUS

## 2023-05-14 MED ORDER — LIDOCAINE HCL 1 % IJ SOLN
INTRAMUSCULAR | Status: AC
  Filled 2023-05-14: qty 60

## 2023-05-14 MED ORDER — FENTANYL CITRATE (PF) 100 MCG/2ML IJ SOLN
INTRAMUSCULAR | Status: DC | PRN
  Administered 2023-05-14: 50 ug via INTRAVENOUS
  Administered 2023-05-14 (×2): 25 ug via INTRAVENOUS

## 2023-05-14 MED ORDER — SODIUM CHLORIDE 0.9 % IV SOLN: INTRAVENOUS | Status: DC

## 2023-05-14 MED ORDER — MIDAZOLAM HCL 2 MG/2ML IJ SOLN
INTRAMUSCULAR | Status: DC | PRN
  Administered 2023-05-14: 2 mg via INTRAVENOUS
  Administered 2023-05-14 (×2): 1 mg via INTRAVENOUS

## 2023-05-14 MED ORDER — FENTANYL CITRATE (PF) 100 MCG/2ML IJ SOLN
INTRAMUSCULAR | Status: AC
  Filled 2023-05-14: qty 2

## 2023-05-14 MED ORDER — ACETAMINOPHEN 325 MG PO TABS: 325.0000 mg | ORAL_TABLET | ORAL | Status: DC | PRN

## 2023-05-14 MED ORDER — SODIUM CHLORIDE 0.9 % IV SOLN
80.0000 mg | INTRAVENOUS | Status: AC
  Administered 2023-05-14: 80 mg
  Filled 2023-05-14: qty 2

## 2023-05-14 MED ORDER — CHLORHEXIDINE GLUCONATE CLOTH 2 % EX PADS
6.0000 | MEDICATED_PAD | Freq: Every day | CUTANEOUS | Status: DC
  Administered 2023-05-14: 6 via TOPICAL

## 2023-05-14 MED ORDER — CHLORHEXIDINE GLUCONATE 4 % EX SOLN: 4.0000 | Freq: Once | CUTANEOUS | Status: DC

## 2023-05-14 SURGICAL SUPPLY — 16 items
ADH SKN CLS APL DERMABOND .7 (GAUZE/BANDAGES/DRESSINGS) ×1
CABLE SURG 12 DISP A/V CHANNEL (MISCELLANEOUS) IMPLANT
DERMABOND ADVANCED .7 DNX12 (GAUZE/BANDAGES/DRESSINGS) IMPLANT
DEVICE DSSCT PLSMBLD 3.0S LGHT (MISCELLANEOUS) IMPLANT
DRAPE BRACHIAL (DRAPES) IMPLANT
ELECT REM PT RETURN 9FT ADLT (ELECTROSURGICAL) ×1
ELECTRODE REM PT RTRN 9FT ADLT (ELECTROSURGICAL) IMPLANT
HEMOSTAT SURGICEL 2X4 FIBR (HEMOSTASIS) IMPLANT
ICD GALLANT VR CDVRA500Q (ICD Generator) IMPLANT
KIT SYRINGE INJ CVI SPIKEX1 (MISCELLANEOUS) IMPLANT
PAD ELECT DEFIB RADIOL ZOLL (MISCELLANEOUS) IMPLANT
PLASMABLADE 3.0S W/LIGHT (MISCELLANEOUS) ×1
SUT SILK 0 FSL (SUTURE) IMPLANT
SUT VIC AB 2-0 CT2 27 (SUTURE) IMPLANT
SUT VIC AB 3-0 X1 27 (SUTURE) IMPLANT
TRAY PACEMAKER INSERTION (PACKS) ×1 IMPLANT

## 2023-05-14 NOTE — Discharge Instructions (Signed)
-  Do not turn to the post-op side for 24hrs. -Bedrest until the morning of 6/13. Allowed to get up for bathroom use. -Your post-op site should be kept dry for 24 hours, so no shower for 24 hours. Dermabond "skin glue" will come off within 10-14 days or it will be removed at the next follow up appointment. -No driving for 4 days.

## 2023-05-14 NOTE — H&P (Signed)
Patient Care Team: Pcp, No as PCP - General [name], MD as PCP - Cardiology (Cardiology) Duke Salvia, MD as PCP - Electrophysiology (Cardiology)   HPI  Roy Turner is a 52 y.o. male admitted for ICD generator replacement.  Initially 2010 for secondary prevention with prior CA in the context of cardiogenic shock and acute LAD and RCA occlusion. Efforts at percutaneous intervention were complicated by dissection and he underwent bypass surgery followed 2 days later by the VF arrest. Repeat catheterization demonstrated no clear triggering event and he underwent ICD implantation  Date Cr K Hgb  6/24 1.07 3.8 13.9        DATE TEST EF    4/17 Echo   25-30 %    1/19 LHC   LIMA-LAD p; SVG-OM2 -p SVG-RPDA Total Severe native 3VCAD             Records and Results Reviewed   Past Medical History:  Diagnosis Date   CAD (coronary artery disease)    a. 07/2011 Anterior STEMI w/ CGS/Cath: LAD 100, LCX 80, RCA 100-->PCI aborted 2/2 dissection-->CABG x 3 (VG->LAD, Free LIMA->OM2, VG->RCA); b. 07/30/2011 Emergent Cath 2/2 VF arrest: patent grafts w/ CAD distal to graft touchdown; c. 03/2014 Neg MV, EF 29%; d. 12/2017 Cath: LM nl, LAD 90ost/p, 100p/m, D1 90, LCX 60, RCA 80p, 95m, 100d ISR. VG->LAD ok, LIMA->OM2 ok, VG->RCA 100 w/ L->R collats->Med Rx   H/O cardiac arrest    a. 07/2011 VF arrest 2 days following CABG.   HFrEF (heart failure with reduced ejection fraction) (HCC)    a. 03/2016 Echo: EF 25-30%, antsept, ant, antlat, apical AK. Mildly dil LA.   Hyperlipidemia    Ischemic cardiomyopathy    a. 08/2011 s/p SJM 1241-40Q Fortify ST VR single lead AICD; b. 03/2016 Echo: EF 25-30%.    Past Surgical History:  Procedure Laterality Date   CARDIAC CATHETERIZATION     [place]   CARDIAC DEFIBRILLATOR PLACEMENT  08/08/11   SJM Fortify ST VR implanted by Dr Selena Batten [facility]   CORONARY ARTERY BYPASS GRAFT  07/28/11   LIMA to OM, SVG to LAD, SVGF to PDA   LEFT  HEART CATH AND CORS/GRAFTS ANGIOGRAPHY Left 12/14/2017   Procedure: LEFT HEART CATH AND CORS/GRAFTS ANGIOGRAPHY;  Surgeon: [name], MD;  Location: [facility] INVASIVE CV LAB;  Service: Cardiovascular;  Laterality: Left;    Current Facility-Administered Medications  Medication Dose Route Frequency Provider Last Rate Last Admin   0.9 %  sodium chloride infusion   Intravenous Continuous Riddle, Luella Cook, NP       0.9 %  sodium chloride infusion   Intravenous Continuous Sherie Don, NP 50 mL/hr at 05/14/23 0717 New Bag at 05/14/23 0717   Chlorhexidine Gluconate Cloth 2 % PADS 6 each  6 each Topical Q0600 Duke Salvia, MD       gentamicin (GARAMYCIN) 80 mg in sodium chloride 0.9 % 500 mL irrigation  80 mg Irrigation On Call Sherie Don, NP       vancomycin (VANCOREADY) IVPB 1500 mg/300 mL  1,500 mg Intravenous On Call Sherie Don, NP        Allergies  Allergen Reactions   Penicillins Hives    As a child      Social History   Tobacco Use   Smoking status: Former    Packs/day: 1.00    Years: 15.00    Additional pack years: 0.00    Total pack  years: 15.00    Types: Cigarettes    Quit date: 07/02/2011    Years since quitting: 11.8   Smokeless tobacco: Never  Vaping Use   Vaping Use: Former  Substance Use Topics   Alcohol use: Not Currently    Comment: occasional   Drug use: No     Family History  Problem Relation Age of Onset   Cancer Father      Current Meds  Medication Sig   aspirin 81 MG tablet Take 81 mg by mouth daily.   carvedilol (COREG) 3.125 MG tablet Take 1 tablet (3.125 mg total) by mouth 2 (two) times daily.   ENTRESTO 24-26 MG TAKE 1 TABLET BY MOUTH TWICE A DAY   eplerenone (INSPRA) 25 MG tablet TAKE 1 TABLET BY MOUTH EVERY DAY   ezetimibe (ZETIA) 10 MG tablet TAKE 1 TABLET BY MOUTH EVERY DAY   KLOR-CON M20 20 MEQ tablet TAKE 1 TABLET BY MOUTH EVERY DAY   magnesium oxide (MAG-OX) 400 MG tablet Take 1 tablet (400 mg total) by mouth 2 (two) times  daily.   Multiple Vitamin (MULTIVITAMIN) capsule Take 1 capsule by mouth daily.   pantoprazole (PROTONIX) 40 MG tablet TAKE 1 TABLET BY MOUTH EVERY DAY   rosuvastatin (CRESTOR) 40 MG tablet TAKE 1 TABLET BY MOUTH EVERY DAY   torsemide (DEMADEX) 20 MG tablet TAKE 2 TABLET ONCE DAILY IN THE MORNING AND 1 TABLET IN THE AFTERNOON AS NEEDED   VASCEPA 1 g capsule TAKE 2 CAPSULES BY MOUTH TWICE A DAY     Review of Systems negative except from HPI and PMH  Physical Exam BP 119/70   Pulse 70   Temp 97.7 F (36.5 C) (Oral)   Resp 16   Ht 6' (1.829 m)   Wt 105.7 kg   SpO2 95%   BMI 31.60 kg/m  Well developed and well nourished in no acute distress HENT normal E scleral and icterus clear Neck Supple JVP flat; carotids brisk and full Clear to ausculation Regular rate and rhythm, no murmurs gallops or rub Soft with active bowel sounds No clubbing cyanosis  Edema Alert and oriented, grossly normal motor and sensory function Skin Warm and Dry    Assessment and  Plan  Ischemic cardiomyopathy   Morbid obesity   Congestive heart failure-chronic-systolic   Implantable defibrillator [employer]     Pt for generator replacement. We have reviewed the benefits and risks of generator replacement.  These include but are not limited to lead fracture and infection.  The patient understands, agrees and is willing to proceed.    Will plan outpatient echo to reassess LV fucntion  with changing exercise tolerance, will review with MA regarding reassessment of Coroanry arteries and refer to heart failure with the question for them of adjunctive technologies ie CCM, or neuromodiulation

## 2023-05-15 ENCOUNTER — Encounter: Payer: Self-pay | Admitting: Internal Medicine

## 2023-05-25 ENCOUNTER — Encounter: Payer: BLUE CROSS/BLUE SHIELD | Admitting: Cardiology

## 2023-05-26 NOTE — Patient Instructions (Incomplete)
OK to shower Let warm soapy water run down incision Do not scrub incision Pat dry with towel  Keep incision open to air - no ointments, creams, salves, or bandages.  Call device clinic if incision opens, has drainage, or begins to hurt more.

## 2023-05-26 NOTE — Progress Notes (Unsigned)

## 2023-05-27 ENCOUNTER — Ambulatory Visit: Payer: BLUE CROSS/BLUE SHIELD | Attending: Cardiology | Admitting: Cardiology

## 2023-05-27 DIAGNOSIS — Z95810 Presence of automatic (implantable) cardiac defibrillator: Secondary | ICD-10-CM

## 2023-05-27 LAB — CUP PACEART INCLINIC DEVICE CHECK
Battery Remaining Longevity: 128 mo
Brady Statistic RV Percent Paced: 0 %
Date Time Interrogation Session: 20240626173152
HighPow Impedance: 53.3979
Implantable Lead Connection Status: 753985
Implantable Lead Implant Date: 20120907
Implantable Lead Location: 753860
Implantable Pulse Generator Implant Date: 20240613
Lead Channel Impedance Value: 450 Ohm
Lead Channel Pacing Threshold Amplitude: 1 V
Lead Channel Pacing Threshold Amplitude: 1 V
Lead Channel Pacing Threshold Pulse Width: 0.5 ms
Lead Channel Pacing Threshold Pulse Width: 0.5 ms
Lead Channel Sensing Intrinsic Amplitude: 12 mV
Lead Channel Setting Pacing Amplitude: 2.5 V
Lead Channel Setting Pacing Pulse Width: 0.5 ms
Lead Channel Setting Sensing Sensitivity: 0.5 mV
Pulse Gen Serial Number: 211017160

## 2023-06-22 ENCOUNTER — Other Ambulatory Visit: Payer: Self-pay | Admitting: Cardiovascular Disease

## 2023-06-22 DIAGNOSIS — I5022 Chronic systolic (congestive) heart failure: Secondary | ICD-10-CM

## 2023-06-24 ENCOUNTER — Other Ambulatory Visit: Payer: Self-pay | Admitting: Cardiovascular Disease

## 2023-06-24 ENCOUNTER — Other Ambulatory Visit: Payer: Self-pay | Admitting: Nurse Practitioner

## 2023-08-14 ENCOUNTER — Ambulatory Visit (INDEPENDENT_AMBULATORY_CARE_PROVIDER_SITE_OTHER): Payer: BLUE CROSS/BLUE SHIELD

## 2023-08-14 DIAGNOSIS — I5022 Chronic systolic (congestive) heart failure: Secondary | ICD-10-CM

## 2023-08-14 LAB — CUP PACEART REMOTE DEVICE CHECK
Battery Remaining Longevity: 125 mo
Battery Remaining Percentage: 95.5 %
Battery Voltage: 3.08 V
Brady Statistic RV Percent Paced: 0 %
Date Time Interrogation Session: 20240913020019
HighPow Impedance: 47 Ohm
Implantable Lead Connection Status: 753985
Implantable Lead Implant Date: 20120907
Implantable Lead Location: 753860
Implantable Pulse Generator Implant Date: 20240613
Lead Channel Impedance Value: 440 Ohm
Lead Channel Pacing Threshold Amplitude: 1 V
Lead Channel Pacing Threshold Pulse Width: 0.5 ms
Lead Channel Sensing Intrinsic Amplitude: 12 mV
Lead Channel Setting Pacing Amplitude: 2.5 V
Lead Channel Setting Pacing Pulse Width: 0.5 ms
Lead Channel Setting Sensing Sensitivity: 0.5 mV
Pulse Gen Serial Number: 211017160

## 2023-08-20 NOTE — Progress Notes (Signed)
Remote ICD transmission.   

## 2023-09-01 ENCOUNTER — Encounter: Payer: Self-pay | Admitting: Internal Medicine

## 2023-09-01 ENCOUNTER — Ambulatory Visit: Payer: BLUE CROSS/BLUE SHIELD

## 2023-09-01 ENCOUNTER — Ambulatory Visit: Payer: BLUE CROSS/BLUE SHIELD | Attending: Internal Medicine | Admitting: Internal Medicine

## 2023-09-01 VITALS — BP 110/70 | HR 63 | Ht 72.0 in | Wt 227.0 lb

## 2023-09-01 DIAGNOSIS — Z95810 Presence of automatic (implantable) cardiac defibrillator: Secondary | ICD-10-CM

## 2023-09-01 DIAGNOSIS — I25708 Atherosclerosis of coronary artery bypass graft(s), unspecified, with other forms of angina pectoris: Secondary | ICD-10-CM

## 2023-09-01 DIAGNOSIS — I5022 Chronic systolic (congestive) heart failure: Secondary | ICD-10-CM

## 2023-09-01 DIAGNOSIS — E782 Mixed hyperlipidemia: Secondary | ICD-10-CM

## 2023-09-01 DIAGNOSIS — I255 Ischemic cardiomyopathy: Secondary | ICD-10-CM

## 2023-09-01 LAB — CUP PACEART INCLINIC DEVICE CHECK
Battery Remaining Longevity: 126 mo
Brady Statistic RV Percent Paced: 0 %
Date Time Interrogation Session: 20241001133249
HighPow Impedance: 58.4964
Implantable Lead Connection Status: 753985
Implantable Lead Implant Date: 20120907
Implantable Lead Location: 753860
Implantable Pulse Generator Implant Date: 20240613
Lead Channel Impedance Value: 462.5 Ohm
Lead Channel Pacing Threshold Amplitude: 0.75 V
Lead Channel Pacing Threshold Amplitude: 0.75 V
Lead Channel Pacing Threshold Pulse Width: 0.5 ms
Lead Channel Pacing Threshold Pulse Width: 0.5 ms
Lead Channel Sensing Intrinsic Amplitude: 12 mV
Lead Channel Setting Pacing Amplitude: 2.5 V
Lead Channel Setting Pacing Pulse Width: 0.5 ms
Lead Channel Setting Sensing Sensitivity: 0.5 mV
Pulse Gen Serial Number: 211017160

## 2023-09-01 NOTE — Patient Instructions (Signed)
Medication Instructions:  No changes to current medications. *If you need a refill on your cardiac medications before your next appointment, please call your pharmacy*   Lab Work: None If you have labs (blood work) drawn today and your tests are completely normal, you will receive your results only by: MyChart Message (if you have MyChart) OR A paper copy in the mail If you have any lab test that is abnormal or we need to change your treatment, we will call you to review the results.   Testing/Procedures: Roy Turner- Long Term Monitor Instructions  Your physician has requested you wear a ZIO patch monitor for 14 days.  This is a single patch monitor. [employer] supplies one patch monitor per enrollment. Additional stickers are not available. Please do not apply patch if you will be having a Nuclear Stress Test, Echocardiogram, Cardiac CT, MRI, or Chest Xray during the period you would be wearing the monitor. The patch cannot be worn during these tests. You cannot remove and re-apply the ZIO XT patch monitor.  Your ZIO patch monitor will be mailed 3 day USPS to your address on file. It may take 3-5 days to receive your monitor after you have been enrolled.  Once you have received your monitor, please review the enclosed instructions. Your monitor has already been registered assigning a specific monitor serial # to you.  Billing and Patient Assistance Program Information  We have supplied [employer] with any of your insurance information on file for billing purposes. [employer] offers a sliding scale Patient Assistance Program for patients that do not have insurance, or whose insurance does not completely cover the cost of the ZIO monitor. You must apply for the Patient Assistance Program to qualify for this discounted rate.  To apply, please call [employer] at [phone], select option 4, select option 2, ask to apply for Patient Assistance Program. Roy Turner will ask your household income, and how many  people are in your household. They will quote your out-of-pocket cost based on that information.  [employer] will also be able to set up a 42-month, interest-free payment plan if needed.  Applying the monitor   Shave hair from upper left chest.  Hold abrader disc by orange tab. Rub abrader in 40 strokes over the upper left chest as indicated in your monitor instructions.  Clean area with 4 enclosed alcohol pads. Let dry.  Apply patch as indicated in monitor instructions. Patch will be placed under collarbone on left side of chest with arrow pointing upward.  Rub patch adhesive wings for 2 minutes. Remove white label marked "1". Remove the white label marked "2". Rub patch adhesive wings for 2 additional minutes.  While looking in a mirror, press and release button in center of patch. A small green light will flash 3-4 times. This will be your only indicator that the monitor has been turned on.  Do not shower for the first 24 hours. You may shower after the first 24 hours.  Press the button if you feel a symptom. You will hear a small click. Record Date, Time and Symptom in the Patient Logbook.  When you are ready to remove the patch, follow instructions on the last 2 pages of Patient Logbook. Stick patch monitor onto the last page of Patient Logbook.  Place Patient Logbook in the blue and white box. Use locking tab on box and tape box closed securely. The blue and white box has prepaid postage on it. Please place it in the mailbox as soon as  possible. Your physician should have your test results approximately 7 days after the monitor has been mailed back to [facility].  Call [facility] Customer Care at [phone] if you have questions regarding your ZIO XT patch monitor. Call them immediately if you see an orange light blinking on your monitor.  If your monitor falls off in less than 4 days, contact our Monitor department at [phone].  If your monitor becomes loose or falls off after 4  days call [employer] at [phone] for suggestions on securing your monitor    Follow-Up: At [facility], you and your health needs are our priority.  As part of our continuing mission to provide you with exceptional heart care, we have created designated Provider Care Teams.  These Care Teams include your primary Cardiologist (physician) and Advanced Practice Providers (APPs -  Physician Assistants and Nurse Practitioners) who all work together to provide you with the care you need, when you need it.  We recommend signing up for the patient portal called "MyChart".  Sign up information is provided on this After Visit Summary.  MyChart is used to connect with patients for Virtual Visits (Telemedicine).  Patients are able to view lab/test results, encounter notes, upcoming appointments, etc.  Non-urgent messages can be sent to your provider as well.   To learn more about what you can do with MyChart, go to [email or web address].    Your next appointment:   3 month(s)  Provider:   You may see Roy Bears, MD or one of the following Advanced Practice Providers on your designated Care Team:   Roy Ducking, NP Roy Listen, PA-C Roy Fransico Michael, PA-C Roy Quest, NP   9 months follow-up with Dr Roy Turner

## 2023-09-01 NOTE — Progress Notes (Signed)
Patient Care Team: Patient, No Pcp Per as PCP - General (General Practice) [name], MD as PCP - Cardiology (Cardiology) Duke Salvia, MD as PCP - Electrophysiology (Cardiology)   HPI  Roy Turner is a 52 y.o. male Seen in follow-up of an St Jude  ICD implanted 2010 for secondary prevention.   Gen  Change 6/24  History of cardiac arrest in the context of cardiogenic shock and acute LAD and RCA occlusion. Efforts at percutaneous intervention were complicated by dissection and he underwent bypass surgery followed 2 days later by the VF arrest. Repeat catheterization demonstrated no clear triggering event and he underwent ICD implantation   Dr. Ranae Palms note 9/21 notes that his son was diagnosed with Ewing's.  He has completed therapy.  No longer playing football.  Will matriculate to [facility] in the fall.  The patient denies, orthopnea or peripheral edema.  There have been no palpitations or syncope.  Complains of intermittent but improving chest pain over the device pocket Also complains of shortness of breath most notably when he goes from bending over to standing.  No associated tachy palpitations Orthostatic lightheadedness.      DATE TEST EF   4/17 Echo   25-30 %   1/19 LHC   LIMA-LAD p; SVG-OM2 -p SVG-RPDA Total Severe native 3VCAD        Date Cr K Hgb  6/24 1.07 3.8 13.9            Past Medical History:  Diagnosis Date   CAD (coronary artery disease)    a. 07/2011 Anterior STEMI w/ CGS/Cath: LAD 100, LCX 80, RCA 100-->PCI aborted 2/2 dissection-->CABG x 3 (VG->LAD, Free LIMA->OM2, VG->RCA); b. 07/30/2011 Emergent Cath 2/2 VF arrest: patent grafts w/ CAD distal to graft touchdown; c. 03/2014 Neg MV, EF 29%; d. 12/2017 Cath: LM nl, LAD 90ost/p, 100p/m, D1 90, LCX 60, RCA 80p, 50m, 100d ISR. VG->LAD ok, LIMA->OM2 ok, VG->RCA 100 w/ L->R collats->Med Rx   H/O cardiac arrest    a. 07/2011 VF arrest 2 days following CABG.   HFrEF (heart failure with reduced ejection  fraction) (HCC)    a. 03/2016 Echo: EF 25-30%, antsept, ant, antlat, apical AK. Mildly dil LA.   Hyperlipidemia    Ischemic cardiomyopathy    a. 08/2011 s/p SJM 1241-40Q Fortify ST VR single lead AICD; b. 03/2016 Echo: EF 25-30%.    Past Surgical History:  Procedure Laterality Date   CARDIAC CATHETERIZATION     [place]   CARDIAC DEFIBRILLATOR PLACEMENT  08/08/11   SJM Fortify ST VR implanted by Dr Selena Batten at [facility]   CORONARY ARTERY BYPASS GRAFT  07/28/11   LIMA to OM, SVG to LAD, SVGF to PDA   ICD GENERATOR CHANGEOUT N/A 05/14/2023   Procedure: ICD GENERATOR CHANGEOUT;  Surgeon: Duke Salvia, MD;  Location: [facility] INVASIVE CV LAB;  Service: Cardiovascular;  Laterality: N/A;   LEFT HEART CATH AND CORS/GRAFTS ANGIOGRAPHY Left 12/14/2017   Procedure: LEFT HEART CATH AND CORS/GRAFTS ANGIOGRAPHY;  Surgeon: [name], MD;  Location: [facility] INVASIVE CV LAB;  Service: Cardiovascular;  Laterality: Left;    Current Outpatient Medications  Medication Sig Dispense Refill   aspirin 81 MG tablet Take 81 mg by mouth daily.     carvedilol (COREG) 3.125 MG tablet Take 1 tablet (3.125 mg total) by mouth 2 (two) times daily. 180 tablet 3   eplerenone (INSPRA) 25 MG tablet TAKE 1 TABLET BY MOUTH EVERY DAY 90  tablet 0   ezetimibe (ZETIA) 10 MG tablet TAKE 1 TABLET BY MOUTH EVERY DAY 90 tablet 1   KLOR-CON M20 20 MEQ tablet TAKE 1 TABLET BY MOUTH EVERY DAY 90 tablet 0   magnesium oxide (MAG-OX) 400 MG tablet Take 1 tablet (400 mg total) by mouth 2 (two) times daily. 6 tablet 0   Multiple Vitamin (MULTIVITAMIN) capsule Take 1 capsule by mouth daily.     nitroGLYCERIN (NITROSTAT) 0.4 MG SL tablet Place 1 tablet (0.4 mg total) under the tongue every 5 (five) minutes as needed for chest pain. 25 tablet 1   pantoprazole (PROTONIX) 40 MG tablet TAKE 1 TABLET BY MOUTH EVERY DAY 90 tablet 2   rosuvastatin (CRESTOR) 40 MG tablet TAKE 1 TABLET BY MOUTH EVERY DAY 90 tablet 1    sacubitril-valsartan (ENTRESTO) 24-26 MG TAKE 1 TABLET BY MOUTH TWICE A DAY 180 tablet 0   torsemide (DEMADEX) 20 MG tablet TAKE 2 TABLET ONCE DAILY IN THE MORNING AND 1 TABLET IN THE AFTERNOON AS NEEDED 270 tablet 1   VASCEPA 1 g capsule TAKE 2 CAPSULES BY MOUTH TWICE A DAY 360 capsule 0   No current facility-administered medications for this visit.    Allergies  Allergen Reactions   Penicillins Hives    As a child      Review of Systems negative except from HPI and PMH  Physical Exam BP 110/70 (BP Location: Left Arm, Patient Position: Sitting, Cuff Size: Large)   Pulse 63   Ht 6' (1.829 m)   Wt 227 lb (103 kg)   SpO2 98%   BMI 30.79 kg/m  Well developed and well nourished in no acute distress HENT normal Neck supple with JVP-flat Clear Device pocket well healed; without hematoma or erythema.  There is no tethering  Regular rate and rhythm, no  gallop No  murmur Abd-soft with active BS No Clubbing cyanosis   edema Skin-warm and dry A & Oriented  Grossly normal sensory and motor function  ECG sinus at 62 Intervals 20/10/40 PAC  Device function is normal. Programming changes none  See Paceart for details    Assessment and  Plan  Ischemic cardiomyopathy  Morbid obesity  Dyspnea with standing  Orthostatic lightheadedness  Congestive heart failure-chronic-systolic   Implantable defibrillator St. Jude      No symptoms of ischemia.  Continue with aspirin and antilipid therapies.  With his cardiomyopathy, continue the Entresto eplerenone and carvedilol.  Orthostatic lightheadedness restricts up titration of his guideline directed therapy.  It might be that he could take an SGLT2 and decrease of his diuretic, I will defer this to Dr. Kennith Maes.  I am perplexed by the dyspnea that is associated with standing.  I cannot imagine from a pulmonary mechanical point of view why this would happen more from a volume point of view because his shift should be gravitational.   Makes me wonder whether he is having orthostatic tachycardia which is triggering the dyspnea.  We will use a ZIO monitor to try to clarify

## 2023-09-04 ENCOUNTER — Other Ambulatory Visit: Payer: Self-pay | Admitting: Cardiovascular Disease

## 2023-09-04 ENCOUNTER — Other Ambulatory Visit: Payer: Self-pay | Admitting: Nurse Practitioner

## 2023-09-04 DIAGNOSIS — I5022 Chronic systolic (congestive) heart failure: Secondary | ICD-10-CM | POA: Diagnosis not present

## 2023-09-04 DIAGNOSIS — I25708 Atherosclerosis of coronary artery bypass graft(s), unspecified, with other forms of angina pectoris: Secondary | ICD-10-CM

## 2023-09-04 DIAGNOSIS — Z95810 Presence of automatic (implantable) cardiac defibrillator: Secondary | ICD-10-CM

## 2023-09-04 DIAGNOSIS — E782 Mixed hyperlipidemia: Secondary | ICD-10-CM

## 2023-09-04 DIAGNOSIS — I255 Ischemic cardiomyopathy: Secondary | ICD-10-CM | POA: Diagnosis not present

## 2023-09-11 ENCOUNTER — Other Ambulatory Visit: Payer: Self-pay | Admitting: Cardiology

## 2023-09-11 DIAGNOSIS — I5022 Chronic systolic (congestive) heart failure: Secondary | ICD-10-CM

## 2023-09-30 ENCOUNTER — Other Ambulatory Visit: Payer: Self-pay | Admitting: Cardiovascular Disease

## 2023-09-30 ENCOUNTER — Encounter: Payer: Self-pay | Admitting: Cardiovascular Disease

## 2023-09-30 MED ORDER — PANTOPRAZOLE SODIUM 40 MG PO TBEC: 40.0000 mg | DELAYED_RELEASE_TABLET | Freq: Every day | ORAL | 2 refills | Status: DC

## 2023-10-07 ENCOUNTER — Encounter: Payer: Self-pay | Admitting: Cardiovascular Disease

## 2023-10-07 MED ORDER — ROSUVASTATIN CALCIUM 40 MG PO TABS: 40.0000 mg | ORAL_TABLET | Freq: Every day | ORAL | 3 refills | Status: DC

## 2023-10-08 ENCOUNTER — Telehealth: Payer: Self-pay

## 2023-10-08 ENCOUNTER — Other Ambulatory Visit (HOSPITAL_COMMUNITY): Payer: Self-pay

## 2023-10-08 NOTE — Telephone Encounter (Signed)
Pharmacy Patient Advocate Encounter   Received notification from CoverMyMeds that prior authorization for ICOSAPENT ETHYL 1 GM is required/requested.   Insurance verification completed.   The patient is insured through [facility] .   Per test claim: PA required; PA submitted to above mentioned insurance via CoverMyMeds Key/confirmation #/EOC [id number] Status is pending

## 2023-10-09 ENCOUNTER — Other Ambulatory Visit (HOSPITAL_COMMUNITY): Payer: Self-pay

## 2023-10-09 NOTE — Telephone Encounter (Signed)
Pharmacy Patient Advocate Encounter  Received notification from [employer] [facility] that Prior Authorization for ICOSAPENT has been APPROVED from 10/08/23 to 10/07/24

## 2023-10-10 ENCOUNTER — Other Ambulatory Visit: Payer: Self-pay | Admitting: Cardiovascular Disease

## 2023-11-13 ENCOUNTER — Ambulatory Visit: Payer: BLUE CROSS/BLUE SHIELD | Attending: Internal Medicine

## 2023-11-13 DIAGNOSIS — I255 Ischemic cardiomyopathy: Secondary | ICD-10-CM

## 2023-11-13 LAB — CUP PACEART REMOTE DEVICE CHECK
Battery Remaining Longevity: 124 mo
Battery Remaining Percentage: 95 %
Battery Voltage: 3.05 V
Brady Statistic RV Percent Paced: 0 %
Date Time Interrogation Session: 20241213012259
HighPow Impedance: 49 Ohm
Implantable Lead Connection Status: 753985
Implantable Lead Implant Date: 20120907
Implantable Lead Location: 753860
Implantable Pulse Generator Implant Date: 20240613
Lead Channel Impedance Value: 450 Ohm
Lead Channel Pacing Threshold Amplitude: 0.75 V
Lead Channel Pacing Threshold Pulse Width: 0.5 ms
Lead Channel Sensing Intrinsic Amplitude: 12 mV
Lead Channel Setting Pacing Amplitude: 2.5 V
Lead Channel Setting Pacing Pulse Width: 0.5 ms
Lead Channel Setting Sensing Sensitivity: 0.5 mV
Pulse Gen Serial Number: 211017160

## 2023-12-06 ENCOUNTER — Other Ambulatory Visit: Payer: Self-pay | Admitting: Cardiovascular Disease

## 2023-12-06 ENCOUNTER — Other Ambulatory Visit: Payer: Self-pay | Admitting: Nurse Practitioner

## 2023-12-06 DIAGNOSIS — I5022 Chronic systolic (congestive) heart failure: Secondary | ICD-10-CM

## 2023-12-08 NOTE — Telephone Encounter (Signed)
 Last office visit: 03/13/23 with plan to f/u in 6 months   Next visit: 12/11/23

## 2023-12-11 ENCOUNTER — Ambulatory Visit: Payer: BC Managed Care – PPO | Attending: Cardiovascular Disease | Admitting: Cardiovascular Disease

## 2023-12-11 ENCOUNTER — Encounter: Payer: Self-pay | Admitting: Cardiovascular Disease

## 2023-12-11 VITALS — BP 80/60 | HR 71 | Ht 72.0 in | Wt 229.2 lb

## 2023-12-11 DIAGNOSIS — E782 Mixed hyperlipidemia: Secondary | ICD-10-CM | POA: Diagnosis not present

## 2023-12-11 DIAGNOSIS — I25118 Atherosclerotic heart disease of native coronary artery with other forms of angina pectoris: Secondary | ICD-10-CM

## 2023-12-11 DIAGNOSIS — I5022 Chronic systolic (congestive) heart failure: Secondary | ICD-10-CM

## 2023-12-11 MED ORDER — EPLERENONE 25 MG PO TABS: 25.0000 mg | ORAL_TABLET | Freq: Every day | ORAL | 3 refills | Status: DC

## 2023-12-11 MED ORDER — POTASSIUM CHLORIDE CRYS ER 20 MEQ PO TBCR: 20.0000 meq | EXTENDED_RELEASE_TABLET | Freq: Every day | ORAL | 3 refills | Status: DC

## 2023-12-11 MED ORDER — ICOSAPENT ETHYL 1 G PO CAPS: 2.0000 g | ORAL_CAPSULE | Freq: Two times a day (BID) | ORAL | 3 refills | Status: DC

## 2023-12-11 NOTE — Progress Notes (Signed)
 Cardiology Office Note   Date:  12/11/2023   ID:  Roy Turner, DOB September 13, 1971, MRN [id number]  PCP:  Patient, No Pcp Per  Cardiologist:   Deatrice Cage, MD   Chief Complaint  Patient presents with   Follow-up    3 month f/u no complaints today. Meds reviewed verbally with pt.       History of Present Illness: Roy Turner is a 53 y.o. male who presents for a followup visit regarding coronary artery disease and chronic systolic heart failure.  He has known h/o CAD s/p anterior STEMI with cardiogenic shock in 2012. cardiac catheterization showed 100% LAD occlusion, 100% RCA occlusion, and 80% LCx stenosis.  Intervention was performed but per report, this was aborted due to dissection.  He underwent CABG on 07/28/11.  2 days later, he had a VF arrest.  Emergent cath revealed patent CABG grafts.  He underwent implantation of a SJM Fortify ST VR ICD 08/08/11.   Most recent echocardiogram in April 2017 showed an ejection fraction of 25-30% with akinesis of the anteroseptal, anterior, anterolateral and apical myocardium. Cardiac catheterization  in January, 2019 showed significant underlying three-vessel coronary artery disease with patent LIMA to OM 2 and SVG to LAD.  SVG to RCA was occluded with chronically occluded native right coronary artery with right to right and left to right collaterals.  LVEDP was normal.  He underwent ICD generator replacement in June of last year with no issues.  He has been doing well with no chest pain or worsening dyspnea.  He tries to exercise on a regular basis.  His blood pressure is low but he denies dizziness or syncope.   Past Medical History:  Diagnosis Date   CAD (coronary artery disease)    a. 07/2011 Anterior STEMI w/ CGS/Cath: LAD 100, LCX 80, RCA 100-->PCI aborted 2/2 dissection-->CABG x 3 (VG->LAD, Free LIMA->OM2, VG->RCA); b. 07/30/2011 Emergent Cath 2/2 VF arrest: patent grafts w/ CAD distal to graft touchdown; c. 03/2014 Neg MV, EF 29%; d. 12/2017  Cath: LM nl, LAD 90ost/p, 100p/m, D1 90, LCX 60, RCA 80p, 18m, 100d ISR. VG->LAD ok, LIMA->OM2 ok, VG->RCA 100 w/ L->R collats->Med Rx   H/O cardiac arrest    a. 07/2011 VF arrest 2 days following CABG.   HFrEF (heart failure with reduced ejection fraction) (HCC)    a. 03/2016 Echo: EF 25-30%, antsept, ant, antlat, apical AK. Mildly dil LA.   Hyperlipidemia    Ischemic cardiomyopathy    a. 08/2011 s/p SJM 1241-40Q Fortify ST VR single lead AICD; b. 03/2016 Echo: EF 25-30%.    Past Surgical History:  Procedure Laterality Date   CARDIAC CATHETERIZATION     [place]   CARDIAC DEFIBRILLATOR PLACEMENT  08/08/11   SJM Fortify ST VR implanted by Dr Luke [facility]   CORONARY ARTERY BYPASS GRAFT  07/28/11   LIMA to OM, SVG to LAD, SVGF to PDA   ICD GENERATOR CHANGEOUT N/A 05/14/2023   Procedure: ICD GENERATOR CHANGEOUT;  Surgeon: Fernande Elspeth BROCKS, MD;  Location: [facility] INVASIVE CV LAB;  Service: Cardiovascular;  Laterality: N/A;   LEFT HEART CATH AND CORS/GRAFTS ANGIOGRAPHY Left 12/14/2017   Procedure: LEFT HEART CATH AND CORS/GRAFTS ANGIOGRAPHY;  Surgeon: Cage Deatrice LABOR, MD;  Location: [facility] INVASIVE CV LAB;  Service: Cardiovascular;  Laterality: Left;     Current Outpatient Medications  Medication Sig Dispense Refill   aspirin  81 MG tablet Take 81 mg by mouth daily.     carvedilol  (COREG ) 3.125  MG tablet Take 1 tablet (3.125 mg total) by mouth 2 (two) times daily. 180 tablet 3   ENTRESTO  24-26 MG TAKE 1 TABLET BY MOUTH TWICE A DAY 180 tablet 0   eplerenone  (INSPRA ) 25 MG tablet TAKE 1 TABLET BY MOUTH EVERY DAY 30 tablet 0   ezetimibe  (ZETIA ) 10 MG tablet TAKE 1 TABLET BY MOUTH EVERY DAY 90 tablet 0   icosapent  Ethyl (VASCEPA ) 1 g capsule TAKE 2 CAPSULES BY MOUTH TWICE A DAY 120 capsule 0   magnesium  oxide (MAG-OX) 400 MG tablet Take 1 tablet (400 mg total) by mouth 2 (two) times daily. 6 tablet 0   Multiple Vitamin (MULTIVITAMIN) capsule Take 1 capsule by mouth daily.      nitroGLYCERIN  (NITROSTAT ) 0.4 MG SL tablet Place 1 tablet (0.4 mg total) under the tongue every 5 (five) minutes as needed for chest pain. 25 tablet 1   pantoprazole  (PROTONIX ) 40 MG tablet Take 1 tablet (40 mg total) by mouth daily. 90 tablet 2   potassium chloride  SA (KLOR-CON  M20) 20 MEQ tablet TAKE 1 TABLET BY MOUTH EVERY DAY 30 tablet 0   rosuvastatin  (CRESTOR ) 40 MG tablet Take 1 tablet (40 mg total) by mouth daily. 90 tablet 3   torsemide  (DEMADEX ) 20 MG tablet TAKE 2 TABLET ONCE DAILY IN THE MORNING AND 1 TABLET IN THE AFTERNOON AS NEEDED 270 tablet 1   No current facility-administered medications for this visit.    Allergies:   Penicillins    Social History:  The patient  reports that he quit smoking about 12 years ago. His smoking use included cigarettes. He started smoking about 27 years ago. He has a 15 pack-year smoking history. He has never used smokeless tobacco. He reports that he does not currently use alcohol. He reports that he does not use drugs.   Family History:  The patient's family history includes Cancer in his father.    ROS:  Please see the history of present illness.   Otherwise, review of systems are positive for none.   All other systems are reviewed and negative.    PHYSICAL EXAM: VS:  BP (!) 80/60 (BP Location: Left Arm, Patient Position: Sitting, Cuff Size: Large)   Pulse 71   Ht 6' (1.829 m)   Wt 229 lb 4 oz (104 kg)   SpO2 98%   BMI 31.09 kg/m  , BMI Body mass index is 31.09 kg/m. GEN: Well nourished, well developed, in no acute distress  HEENT: normal  Neck: Mild JVD, carotid bruits, or masses Cardiac: RRR; no murmurs, rubs, or gallops.  trace edema bilaterally.  Respiratory:  clear to auscultation bilaterally, normal work of breathing GI: soft, nontender+ BS.  Mildly distended likely with some ascites. MS: no deformity or atrophy  Skin: warm and dry, no rash Neuro:  Strength and sensation are intact Psych: euthymic mood, full affect   EKG:   EKG is not ordered today.    Recent Labs: 04/02/2023: ALT 22 05/06/2023: BUN 15; Creatinine, Ser 1.07; Hemoglobin 13.9; Platelets 235; Potassium 3.8; Sodium 138    Lipid Panel    Component Value Date/Time   CHOL 136 04/02/2023 1425   CHOL 126 08/29/2021 1146   TRIG 213 (H) 04/02/2023 1425   HDL 41 04/02/2023 1425   HDL 37 (L) 08/29/2021 1146   CHOLHDL 3.3 04/02/2023 1425   VLDL 43 (H) 04/02/2023 1425   LDLCALC 52 04/02/2023 1425   LDLCALC 48 08/29/2021 1146      Wt Readings from  Last 3 Encounters:  12/11/23 229 lb 4 oz (104 kg)  09/01/23 227 lb (103 kg)  05/14/23 233 lb (105.7 kg)         ASSESSMENT AND PLAN:  1. Coronary artery disease involving native coronary arteries with stable angina: Symptoms are well controlled with medications.  He has chronically occluded RCA with collaterals.  Other grafts were patent.  Continue medical therapy.    2.  Chronic systolic heart failure: He appears to be euvolemic on current dose of torsemide .  Continue Entresto , carvedilol  and eplerenone . Adding an SGLT2 inhibitor is an option but his blood pressure is low today.  3.  Mixed hyperlipidemia: Continue treatment with ezetimibe , Vascepa  and high-dose rosuvastatin .  I reviewed most recent lipid profile done in May which showed an LDL of 52 with improvement in triglyceride to 213.   Disposition:   FU with me in 6 months.  Signed,   Deatrice Cage, MD  12/11/2023 12:35 PM    [facility]

## 2023-12-11 NOTE — Patient Instructions (Signed)
 Medication Instructions:  No changes *If you need a refill on your cardiac medications before your next appointment, please call your pharmacy*   Lab Work: None ordered If you have labs (blood work) drawn today and your tests are completely normal, you will receive your results only by: MyChart Message (if you have MyChart) OR A paper copy in the mail If you have any lab test that is abnormal or we need to change your treatment, we will call you to review the results.   Testing/Procedures: None ordered   Follow-Up: At [facility], you and your health needs are our priority.  As part of our continuing mission to provide you with exceptional heart care, we have created designated Provider Care Teams.  These Care Teams include your primary Cardiologist (physician) and Advanced Practice Providers (APPs -  Physician Assistants and Nurse Practitioners) who all work together to provide you with the care you need, when you need it.  We recommend signing up for the patient portal called "MyChart".  Sign up information is provided on this After Visit Summary.  MyChart is used to connect with patients for Virtual Visits (Telemedicine).  Patients are able to view lab/test results, encounter notes, upcoming appointments, etc.  Non-urgent messages can be sent to your provider as well.   To learn more about what you can do with MyChart, go to [email or web address].    Your next appointment:   6 month(s)  Provider:   You may see [name], MD or one of the following Advanced Practice Providers on your designated Care Team:   [name], NP [name], PA-C [name], PA-C [name], NP Carlos Levering, NP

## 2023-12-17 NOTE — Progress Notes (Signed)
Remote ICD transmission.   

## 2023-12-21 ENCOUNTER — Other Ambulatory Visit: Payer: Self-pay | Admitting: Cardiovascular Disease

## 2023-12-24 ENCOUNTER — Other Ambulatory Visit: Payer: Self-pay | Admitting: Cardiovascular Disease

## 2023-12-25 ENCOUNTER — Other Ambulatory Visit: Payer: Self-pay

## 2023-12-25 MED ORDER — ENTRESTO 24-26 MG PO TABS: 1.0000 | ORAL_TABLET | Freq: Two times a day (BID) | ORAL | 0 refills | Status: DC

## 2023-12-25 MED ORDER — CARVEDILOL 3.125 MG PO TABS: 3.1250 mg | ORAL_TABLET | Freq: Two times a day (BID) | ORAL | 3 refills | Status: AC

## 2024-02-12 ENCOUNTER — Ambulatory Visit (INDEPENDENT_AMBULATORY_CARE_PROVIDER_SITE_OTHER): Payer: BLUE CROSS/BLUE SHIELD

## 2024-02-12 DIAGNOSIS — I255 Ischemic cardiomyopathy: Secondary | ICD-10-CM | POA: Diagnosis not present

## 2024-02-12 LAB — CUP PACEART REMOTE DEVICE CHECK
Battery Remaining Longevity: 120 mo
Battery Remaining Percentage: 92 %
Battery Voltage: 3.04 V
Brady Statistic RV Percent Paced: 0 %
Date Time Interrogation Session: 20250314031040
HighPow Impedance: 50 Ohm
Implantable Lead Connection Status: 753985
Implantable Lead Implant Date: 20120907
Implantable Lead Location: 753860
Implantable Pulse Generator Implant Date: 20240613
Lead Channel Impedance Value: 450 Ohm
Lead Channel Pacing Threshold Amplitude: 0.75 V
Lead Channel Pacing Threshold Pulse Width: 0.5 ms
Lead Channel Sensing Intrinsic Amplitude: 12 mV
Lead Channel Setting Pacing Amplitude: 2.5 V
Lead Channel Setting Pacing Pulse Width: 0.5 ms
Lead Channel Setting Sensing Sensitivity: 0.5 mV
Pulse Gen Serial Number: 211017160

## 2024-03-12 ENCOUNTER — Encounter: Payer: Self-pay | Admitting: Internal Medicine

## 2024-03-16 NOTE — Progress Notes (Signed)
 Remote ICD transmission.

## 2024-03-22 ENCOUNTER — Other Ambulatory Visit: Payer: Self-pay | Admitting: Cardiovascular Disease

## 2024-04-02 ENCOUNTER — Other Ambulatory Visit: Payer: Self-pay | Admitting: Cardiovascular Disease

## 2024-05-13 ENCOUNTER — Ambulatory Visit (INDEPENDENT_AMBULATORY_CARE_PROVIDER_SITE_OTHER): Payer: BLUE CROSS/BLUE SHIELD

## 2024-05-13 DIAGNOSIS — I255 Ischemic cardiomyopathy: Secondary | ICD-10-CM

## 2024-05-13 LAB — CUP PACEART REMOTE DEVICE CHECK
Battery Remaining Longevity: 119 mo
Battery Remaining Percentage: 91 %
Battery Voltage: 3.04 V
Brady Statistic RV Percent Paced: 1 %
Date Time Interrogation Session: 20250613022504
HighPow Impedance: 54 Ohm
Implantable Lead Connection Status: 753985
Implantable Lead Implant Date: 20120907
Implantable Lead Location: 753860
Implantable Pulse Generator Implant Date: 20240613
Lead Channel Impedance Value: 450 Ohm
Lead Channel Pacing Threshold Amplitude: 0.75 V
Lead Channel Pacing Threshold Pulse Width: 0.5 ms
Lead Channel Sensing Intrinsic Amplitude: 12 mV
Lead Channel Setting Pacing Amplitude: 2.5 V
Lead Channel Setting Pacing Pulse Width: 0.5 ms
Lead Channel Setting Sensing Sensitivity: 0.5 mV
Pulse Gen Serial Number: 211017160

## 2024-06-27 ENCOUNTER — Other Ambulatory Visit: Payer: Self-pay | Admitting: Cardiovascular Disease

## 2024-06-30 NOTE — Progress Notes (Signed)
 Remote ICD transmission.

## 2024-07-14 ENCOUNTER — Ambulatory Visit: Payer: Self-pay | Admitting: Cardiovascular Disease

## 2024-08-12 ENCOUNTER — Ambulatory Visit (INDEPENDENT_AMBULATORY_CARE_PROVIDER_SITE_OTHER): Payer: BLUE CROSS/BLUE SHIELD

## 2024-08-12 DIAGNOSIS — I5022 Chronic systolic (congestive) heart failure: Secondary | ICD-10-CM | POA: Diagnosis not present

## 2024-08-12 LAB — CUP PACEART REMOTE DEVICE CHECK
Battery Remaining Longevity: 116 mo
Battery Remaining Percentage: 90 %
Battery Voltage: 3.02 V
Brady Statistic RV Percent Paced: 1 %
Date Time Interrogation Session: 20250912020934
HighPow Impedance: 55 Ohm
Implantable Lead Connection Status: 753985
Implantable Lead Implant Date: 20120907
Implantable Lead Location: 753860
Implantable Pulse Generator Implant Date: 20240613
Lead Channel Impedance Value: 460 Ohm
Lead Channel Pacing Threshold Amplitude: 0.75 V
Lead Channel Pacing Threshold Pulse Width: 0.5 ms
Lead Channel Sensing Intrinsic Amplitude: 12 mV
Lead Channel Setting Pacing Amplitude: 2.5 V
Lead Channel Setting Pacing Pulse Width: 0.5 ms
Lead Channel Setting Sensing Sensitivity: 0.5 mV
Pulse Gen Serial Number: 211017160

## 2024-08-19 NOTE — Progress Notes (Signed)
Remote ICD Transmission.

## 2024-08-22 ENCOUNTER — Ambulatory Visit: Payer: Self-pay | Admitting: Cardiology

## 2024-09-23 ENCOUNTER — Other Ambulatory Visit: Payer: Self-pay | Admitting: Cardiology

## 2024-09-26 NOTE — Telephone Encounter (Signed)
 Please contact pt for future appointment. Pt overdue for 6 Month f/u.

## 2024-09-26 NOTE — Telephone Encounter (Signed)
 LVM to schedule f/u appt

## 2024-10-10 ENCOUNTER — Encounter: Payer: Self-pay | Admitting: *Deleted

## 2024-10-10 ENCOUNTER — Encounter: Payer: Self-pay | Admitting: Cardiovascular Disease

## 2024-11-11 ENCOUNTER — Ambulatory Visit: Payer: BLUE CROSS/BLUE SHIELD

## 2024-11-11 DIAGNOSIS — I5022 Chronic systolic (congestive) heart failure: Secondary | ICD-10-CM | POA: Diagnosis not present

## 2024-11-12 LAB — CUP PACEART REMOTE DEVICE CHECK
Battery Remaining Longevity: 114 mo
Battery Remaining Percentage: 88 %
Battery Voltage: 3.02 V
Brady Statistic RV Percent Paced: 1 %
Date Time Interrogation Session: 20251212020856
HighPow Impedance: 55 Ohm
Implantable Lead Connection Status: 753985
Implantable Lead Implant Date: 20120907
Implantable Lead Location: 753860
Implantable Pulse Generator Implant Date: 20240613
Lead Channel Impedance Value: 490 Ohm
Lead Channel Pacing Threshold Amplitude: 0.75 V
Lead Channel Pacing Threshold Pulse Width: 0.5 ms
Lead Channel Sensing Intrinsic Amplitude: 12 mV
Lead Channel Setting Pacing Amplitude: 2.5 V
Lead Channel Setting Pacing Pulse Width: 0.5 ms
Lead Channel Setting Sensing Sensitivity: 0.5 mV
Pulse Gen Serial Number: 211017160

## 2024-11-16 ENCOUNTER — Ambulatory Visit: Attending: Cardiovascular Disease | Admitting: Cardiovascular Disease

## 2024-11-16 ENCOUNTER — Encounter: Payer: Self-pay | Admitting: Cardiovascular Disease

## 2024-11-16 VITALS — BP 104/60 | HR 69 | Ht 72.0 in | Wt 238.0 lb

## 2024-11-16 DIAGNOSIS — I25118 Atherosclerotic heart disease of native coronary artery with other forms of angina pectoris: Secondary | ICD-10-CM | POA: Diagnosis not present

## 2024-11-16 DIAGNOSIS — E782 Mixed hyperlipidemia: Secondary | ICD-10-CM | POA: Diagnosis not present

## 2024-11-16 DIAGNOSIS — I5022 Chronic systolic (congestive) heart failure: Secondary | ICD-10-CM | POA: Diagnosis not present

## 2024-11-16 MED ORDER — NITROGLYCERIN 0.4 MG SL SUBL: 0.4000 mg | SUBLINGUAL_TABLET | SUBLINGUAL | 0 refills | Status: AC | PRN

## 2024-11-16 MED ORDER — TORSEMIDE 20 MG PO TABS: ORAL_TABLET | ORAL | 1 refills | Status: AC

## 2024-11-16 NOTE — Progress Notes (Signed)
 Cardiology Office Note   Date:  11/16/2024   ID:  Roy Turner, DOB March 26, 1971, MRN [id number]  PCP:  Patient, No Pcp Per  Cardiologist:   Deatrice Cage, MD   Chief Complaint  Patient presents with   Follow-up    OD 6 month f/u no complaints today. Meds reviewed verbally with pt.       History of Present Illness: Roy Turner is a 53 y.o. male who presents for a followup visit regarding coronary artery disease and chronic systolic heart failure.  He has known h/o CAD s/p anterior STEMI with cardiogenic shock in 2012. cardiac catheterization showed 100% LAD occlusion, 100% RCA occlusion, and 80% LCx stenosis.  Intervention was performed but per report, this was aborted due to dissection.  He underwent CABG on 07/28/11.  2 days later, he had a VF arrest.  Emergent cath revealed patent CABG grafts.  He underwent implantation of a SJM Fortify ST VR ICD 08/08/11.   Most recent echocardiogram in April 2017 showed an ejection fraction of 25-30% with akinesis of the anteroseptal, anterior, anterolateral and apical myocardium. Cardiac catheterization  in January, 2019 showed significant underlying three-vessel coronary artery disease with patent LIMA to OM 2 and SVG to LAD.  SVG to RCA was occluded with chronically occluded native right coronary artery with right to right and left to right collaterals.  LVEDP was normal.  He underwent ICD generator replacement in June , 2024.  He has been doing well with no chest pain.  He reports stable exertional dyspnea.  He takes torsemide  40 mg once daily in the morning.  He has occasional dizziness.   Past Medical History:  Diagnosis Date   CAD (coronary artery disease)    a. 07/2011 Anterior STEMI w/ CGS/Cath: LAD 100, LCX 80, RCA 100-->PCI aborted 2/2 dissection-->CABG x 3 (VG->LAD, Free LIMA->OM2, VG->RCA); b. 07/30/2011 Emergent Cath 2/2 VF arrest: patent grafts w/ CAD distal to graft touchdown; c. 03/2014 Neg MV, EF 29%; d. 12/2017 Cath: LM nl, LAD  90ost/p, 100p/m, D1 90, LCX 60, RCA 80p, 47m, 100d ISR. VG->LAD ok, LIMA->OM2 ok, VG->RCA 100 w/ L->R collats->Med Rx   H/O cardiac arrest    a. 07/2011 VF arrest 2 days following CABG.   HFrEF (heart failure with reduced ejection fraction) (HCC)    a. 03/2016 Echo: EF 25-30%, antsept, ant, antlat, apical AK. Mildly dil LA.   Hyperlipidemia    Ischemic cardiomyopathy    a. 08/2011 s/p SJM 1241-40Q Fortify ST VR single lead AICD; b. 03/2016 Echo: EF 25-30%.    Past Surgical History:  Procedure Laterality Date   CARDIAC CATHETERIZATION     [place]   CARDIAC DEFIBRILLATOR PLACEMENT  08/08/11   SJM Fortify ST VR implanted by Dr Luke at [facility]   CORONARY ARTERY BYPASS GRAFT  07/28/11   LIMA to OM, SVG to LAD, SVGF to PDA   ICD GENERATOR CHANGEOUT N/A 05/14/2023   Procedure: ICD GENERATOR CHANGEOUT;  Surgeon: Fernande Elspeth BROCKS, MD;  Location: [facility] INVASIVE CV LAB;  Service: Cardiovascular;  Laterality: N/A;   LEFT HEART CATH AND CORS/GRAFTS ANGIOGRAPHY Left 12/14/2017   Procedure: LEFT HEART CATH AND CORS/GRAFTS ANGIOGRAPHY;  Surgeon: Cage Deatrice LABOR, MD;  Location: [facility] INVASIVE CV LAB;  Service: Cardiovascular;  Laterality: Left;     Current Outpatient Medications  Medication Sig Dispense Refill   aspirin  81 MG tablet Take 81 mg by mouth daily.     carvedilol  (COREG ) 3.125 MG tablet Take 1 tablet (  3.125 mg total) by mouth 2 (two) times daily. 180 tablet 3   eplerenone  (INSPRA ) 25 MG tablet Take 1 tablet (25 mg total) by mouth daily. 90 tablet 3   ezetimibe  (ZETIA ) 10 MG tablet TAKE 1 TABLET BY MOUTH EVERY DAY 90 tablet 3   icosapent  Ethyl (VASCEPA ) 1 g capsule Take 2 capsules (2 g total) by mouth 2 (two) times daily. 360 capsule 3   magnesium  oxide (MAG-OX) 400 MG tablet Take 1 tablet (400 mg total) by mouth 2 (two) times daily. 6 tablet 0   Multiple Vitamin (MULTIVITAMIN) capsule Take 1 capsule by mouth daily.     nitroGLYCERIN  (NITROSTAT ) 0.4 MG SL tablet Place 1 tablet  (0.4 mg total) under the tongue every 5 (five) minutes as needed for chest pain. 25 tablet 1   pantoprazole  (PROTONIX ) 40 MG tablet TAKE 1 TABLET BY MOUTH EVERY DAY 90 tablet 2   potassium chloride  SA (KLOR-CON  M20) 20 MEQ tablet Take 1 tablet (20 mEq total) by mouth daily. 90 tablet 3   rosuvastatin  (CRESTOR ) 40 MG tablet Take 1 tablet (40 mg total) by mouth daily. 90 tablet 0   sacubitril -valsartan  (ENTRESTO ) 24-26 MG TAKE 1 TABLET BY MOUTH TWICE A DAY 180 tablet 3   torsemide  (DEMADEX ) 20 MG tablet TAKE 2 TABLET ONCE DAILY IN THE MORNING AND 1 TABLET IN THE AFTERNOON AS NEEDED 270 tablet 1   No current facility-administered medications for this visit.    Allergies:   Penicillins    Social History:  The patient  reports that he quit smoking about 13 years ago. His smoking use included cigarettes. He started smoking about 28 years ago. He has a 15 pack-year smoking history. He has never used smokeless tobacco. He reports that he does not currently use alcohol. He reports that he does not use drugs.   Family History:  The patient's family history includes Cancer in his father.    ROS:  Please see the history of present illness.   Otherwise, review of systems are positive for none.   All other systems are reviewed and negative.    PHYSICAL EXAM: VS:  BP 104/60 (BP Location: Left Arm, Patient Position: Sitting, Cuff Size: Large)   Pulse 69   Ht 6' (1.829 m)   Wt 238 lb (108 kg)   SpO2 97%   BMI 32.28 kg/m  , BMI Body mass index is 32.28 kg/m. GEN: Well nourished, well developed, in no acute distress  HEENT: normal  Neck: Mild JVD, carotid bruits, or masses Cardiac: RRR; no murmurs, rubs, or gallops.  trace edema bilaterally.  Respiratory:  clear to auscultation bilaterally, normal work of breathing GI: soft, nontender+ BS.  Mildly distended likely with some ascites. MS: no deformity or atrophy  Skin: warm and dry, no rash Neuro:  Strength and sensation are intact Psych: euthymic  mood, full affect   EKG:  EKG is not ordered today.  Normal sinus rhythm Possible Inferior infarct , age undetermined Anterolateral infarct (cited on or before 01-Sep-2023) When compared with ECG of 01-Sep-2023 11:58, Premature ventricular complexes are no longer Present   Recent Labs: No results found for requested labs within last 365 days.    Lipid Panel    Component Value Date/Time   CHOL 136 04/02/2023 1425   CHOL 126 08/29/2021 1146   TRIG 213 (H) 04/02/2023 1425   HDL 41 04/02/2023 1425   HDL 37 (L) 08/29/2021 1146   CHOLHDL 3.3 04/02/2023 1425   VLDL 43 (H)  04/02/2023 1425   LDLCALC 52 04/02/2023 1425   LDLCALC 48 08/29/2021 1146      Wt Readings from Last 3 Encounters:  11/16/24 238 lb (108 kg)  12/11/23 229 lb 4 oz (104 kg)  09/01/23 227 lb (103 kg)         ASSESSMENT AND PLAN:  1. Coronary artery disease involving native coronary arteries with stable angina: Symptoms are well controlled with medications.  He has chronically occluded RCA with collaterals.  Other grafts were patent.  Continue medical therapy.    2.  Chronic systolic heart failure: He appears to be euvolemic on current dose of torsemide .  Continue Entresto , carvedilol  and eplerenone . No EF evaluation since 2017.  I requested a follow-up echocardiogram.  If EF is still reduced, recommend adding an SGLT2 inhibitor and decreasing the dose of torsemide .  Will check routine labs today.  3.  Mixed hyperlipidemia: Continue treatment with ezetimibe , Vascepa  and high-dose rosuvastatin .  I requested lipid and liver profile.    Disposition:   FU with me in 6 months.  Signed,   Deatrice Cage, MD  11/16/2024 2:37 PM    [facility]

## 2024-11-16 NOTE — Patient Instructions (Signed)
 Medication Instructions:  No changes *If you need a refill on your cardiac medications before your next appointment, please call your pharmacy*  Lab Work: Your provider would like for you to have the following labs today: CBC. CMET and Lipid  If you have labs (blood work) drawn today and your tests are completely normal, you will receive your results only by: MyChart Message (if you have MyChart) OR A paper copy in the mail If you have any lab test that is abnormal or we need to change your treatment, we will call you to review the results.  Testing/Procedures: Your physician has requested that you have an echocardiogram. Echocardiography is a painless test that uses sound waves to create images of your heart. It provides your doctor with information about the size and shape of your heart and how well your hearts chambers and valves are working.   You may receive an ultrasound enhancing agent through an IV if needed to better visualize your heart during the echo. This procedure takes approximately one hour.  There are no restrictions for this procedure.  This will take place at [address] [facility] [address], [place] [address]  Please note: We ask at that you not bring children with you during ultrasound (echo/ vascular) testing. Due to room size and safety concerns, children are not allowed in the ultrasound rooms during exams. Our front office staff cannot provide observation of children in our lobby area while testing is being conducted. An adult accompanying a patient to their appointment will only be allowed in the ultrasound room at the discretion of the ultrasound technician under special circumstances. We apologize for any inconvenience.   Follow-Up: At [facility], you and your health needs are our priority.  As part of our continuing mission to provide you with exceptional heart care, our providers are all part of one team.  This team includes your  primary Cardiologist (physician) and Advanced Practice Providers or APPs (Physician Assistants and Nurse Practitioners) who all work together to provide you with the care you need, when you need it.  Your next appointment:   6 month(s)  Provider:   You may see Deatrice Cage, MD or one of the following Advanced Practice Providers on your designated Care Team:   Lonni Meager, NP Lesley Maffucci, PA-C Bernardino Bring, PA-C Cadence Winchester, PA-C Tylene Lunch, NP Barnie Hila, NP    We recommend signing up for the patient portal called MyChart.  Sign up information is provided on this After Visit Summary.  MyChart is used to connect with patients for Virtual Visits (Telemedicine).  Patients are able to view lab/test results, encounter notes, upcoming appointments, etc.  Non-urgent messages can be sent to your provider as well.   To learn more about what you can do with MyChart, go to [email or web address].

## 2024-11-17 LAB — CBC
Hematocrit: 42.8 % (ref 37.5–51.0)
Hemoglobin: 14.4 g/dL (ref 13.0–17.7)
MCH: 31 pg (ref 26.6–33.0)
MCHC: 33.6 g/dL (ref 31.5–35.7)
MCV: 92 fL (ref 79–97)
Platelets: 284 x10E3/uL (ref 150–450)
RBC: 4.65 x10E6/uL (ref 4.14–5.80)
RDW: 13.1 % (ref 11.6–15.4)
WBC: 10 x10E3/uL (ref 3.4–10.8)

## 2024-11-17 LAB — COMPREHENSIVE METABOLIC PANEL WITH GFR
ALT: 19 IU/L (ref 0–44)
AST: 22 IU/L (ref 0–40)
Albumin: 4.7 g/dL (ref 3.8–4.9)
Alkaline Phosphatase: 78 IU/L (ref 47–123)
BUN/Creatinine Ratio: 10 (ref 9–20)
BUN: 12 mg/dL (ref 6–24)
Bilirubin Total: 0.4 mg/dL (ref 0.0–1.2)
CO2: 26 mmol/L (ref 20–29)
Calcium: 9.7 mg/dL (ref 8.7–10.2)
Chloride: 97 mmol/L (ref 96–106)
Creatinine, Ser: 1.26 mg/dL (ref 0.76–1.27)
Globulin, Total: 2.2 g/dL (ref 1.5–4.5)
Glucose: 98 mg/dL (ref 70–99)
Potassium: 3.9 mmol/L (ref 3.5–5.2)
Sodium: 140 mmol/L (ref 134–144)
Total Protein: 6.9 g/dL (ref 6.0–8.5)
eGFR: 69 mL/min/1.73 (ref >59)

## 2024-11-17 LAB — LIPID PANEL
Chol/HDL Ratio: 3.3 ratio (ref 0.0–5.0)
Cholesterol, Total: 127 mg/dL (ref 100–199)
HDL: 38 mg/dL — ABNORMAL LOW (ref >39)
LDL Chol Calc (NIH): 58 mg/dL (ref 0–99)
Triglycerides: 188 mg/dL — ABNORMAL HIGH (ref 0–149)
VLDL Cholesterol Cal: 31 mg/dL (ref 5–40)

## 2024-11-17 NOTE — Progress Notes (Signed)
 Remote ICD Transmission

## 2024-11-18 ENCOUNTER — Ambulatory Visit: Payer: Self-pay | Admitting: Cardiology

## 2024-12-10 ENCOUNTER — Other Ambulatory Visit: Payer: Self-pay | Admitting: Cardiovascular Disease

## 2024-12-10 DIAGNOSIS — I5022 Chronic systolic (congestive) heart failure: Secondary | ICD-10-CM

## 2024-12-11 ENCOUNTER — Other Ambulatory Visit: Payer: Self-pay | Admitting: Cardiovascular Disease

## 2024-12-22 ENCOUNTER — Other Ambulatory Visit: Payer: Self-pay | Admitting: Cardiovascular Disease

## 2024-12-23 ENCOUNTER — Encounter: Payer: Self-pay | Admitting: Cardiovascular Disease

## 2024-12-23 MED ORDER — EZETIMIBE 10 MG PO TABS: 10.0000 mg | ORAL_TABLET | Freq: Every day | ORAL | 3 refills | Status: AC

## 2024-12-27 ENCOUNTER — Encounter: Payer: Self-pay | Admitting: Cardiovascular Disease

## 2024-12-27 MED ORDER — ROSUVASTATIN CALCIUM 40 MG PO TABS: 40.0000 mg | ORAL_TABLET | Freq: Every day | ORAL | 3 refills | Status: AC

## 2025-01-11 ENCOUNTER — Ambulatory Visit

## 2025-05-17 ENCOUNTER — Ambulatory Visit: Admitting: Physician Assistant
